# Patient Record
Sex: Male | Born: 1965 | Race: Black or African American | Hispanic: No | Marital: Married | Smoking: Never smoker
Health system: Southern US, Community
[De-identification: ages and names within clinical notes are randomized; demographics above are authoritative.]

## PROBLEM LIST (undated history)

## (undated) DIAGNOSIS — I10 Essential (primary) hypertension: Secondary | ICD-10-CM

## (undated) DIAGNOSIS — M1990 Unspecified osteoarthritis, unspecified site: Secondary | ICD-10-CM

## (undated) DIAGNOSIS — E669 Obesity, unspecified: Secondary | ICD-10-CM

## (undated) HISTORY — PX: LEG SURGERY: SHX1003

## (undated) HISTORY — PX: GASTROPLASTY DUODENAL SWITCH: SHX1699

## (undated) HISTORY — PX: FOOT SURGERY: SHX648

---

## 2002-03-06 ENCOUNTER — Emergency Department (HOSPITAL_COMMUNITY): Admission: EM | Admit: 2002-03-06 | Discharge: 2002-03-06 | Payer: Self-pay

## 2002-03-13 ENCOUNTER — Encounter: Payer: Self-pay | Admitting: Orthopedic Surgery

## 2002-03-13 ENCOUNTER — Inpatient Hospital Stay (HOSPITAL_COMMUNITY): Admission: RE | Admit: 2002-03-13 | Discharge: 2002-03-14 | Payer: Self-pay | Admitting: Orthopedic Surgery

## 2002-11-04 ENCOUNTER — Ambulatory Visit (HOSPITAL_COMMUNITY): Admission: RE | Admit: 2002-11-04 | Discharge: 2002-11-04 | Payer: Self-pay | Admitting: Cardiology

## 2003-11-16 ENCOUNTER — Ambulatory Visit (HOSPITAL_COMMUNITY): Admission: RE | Admit: 2003-11-16 | Discharge: 2003-11-16 | Payer: Self-pay | Admitting: Orthopedic Surgery

## 2004-04-16 ENCOUNTER — Emergency Department (HOSPITAL_COMMUNITY): Admission: EM | Admit: 2004-04-16 | Discharge: 2004-04-16 | Payer: Self-pay | Admitting: Emergency Medicine

## 2004-04-22 ENCOUNTER — Emergency Department (HOSPITAL_COMMUNITY): Admission: EM | Admit: 2004-04-22 | Discharge: 2004-04-22 | Payer: Self-pay | Admitting: Emergency Medicine

## 2005-03-27 ENCOUNTER — Encounter: Admission: RE | Admit: 2005-03-27 | Discharge: 2005-06-25 | Payer: Self-pay

## 2007-08-06 ENCOUNTER — Encounter (HOSPITAL_BASED_OUTPATIENT_CLINIC_OR_DEPARTMENT_OTHER): Admission: RE | Admit: 2007-08-06 | Discharge: 2007-09-09 | Payer: Self-pay | Admitting: Surgery

## 2007-09-10 ENCOUNTER — Encounter (HOSPITAL_BASED_OUTPATIENT_CLINIC_OR_DEPARTMENT_OTHER): Admission: RE | Admit: 2007-09-10 | Discharge: 2007-10-08 | Payer: Self-pay | Admitting: Surgery

## 2008-03-17 ENCOUNTER — Encounter: Admission: RE | Admit: 2008-03-17 | Discharge: 2008-03-17 | Payer: Self-pay | Admitting: Internal Medicine

## 2010-09-23 ENCOUNTER — Other Ambulatory Visit: Payer: Self-pay | Admitting: Surgery

## 2010-09-23 ENCOUNTER — Other Ambulatory Visit (HOSPITAL_COMMUNITY): Payer: Self-pay | Admitting: Surgery

## 2010-09-23 DIAGNOSIS — Z01818 Encounter for other preprocedural examination: Secondary | ICD-10-CM

## 2010-09-23 DIAGNOSIS — E669 Obesity, unspecified: Secondary | ICD-10-CM

## 2010-09-26 ENCOUNTER — Encounter: Payer: Self-pay | Admitting: Internal Medicine

## 2010-11-01 ENCOUNTER — Encounter: Admit: 2010-11-01 | Payer: Self-pay | Admitting: Surgery

## 2010-11-01 ENCOUNTER — Encounter (HOSPITAL_COMMUNITY): Payer: Self-pay

## 2010-11-01 ENCOUNTER — Ambulatory Visit: Payer: Self-pay | Admitting: *Deleted

## 2010-11-07 ENCOUNTER — Other Ambulatory Visit (HOSPITAL_COMMUNITY): Payer: Self-pay

## 2010-11-07 ENCOUNTER — Inpatient Hospital Stay (HOSPITAL_COMMUNITY): Admission: RE | Admit: 2010-11-07 | Payer: Self-pay | Source: Ambulatory Visit

## 2010-11-18 ENCOUNTER — Other Ambulatory Visit: Payer: Self-pay | Admitting: Surgery

## 2010-11-18 ENCOUNTER — Ambulatory Visit (HOSPITAL_COMMUNITY)
Admission: RE | Admit: 2010-11-18 | Discharge: 2010-11-18 | Disposition: A | Discharge status: Home / Self Care | Payer: BC Managed Care – PPO | Source: Ambulatory Visit | Attending: Surgery | Admitting: Surgery

## 2010-11-18 ENCOUNTER — Other Ambulatory Visit (HOSPITAL_COMMUNITY): Payer: Self-pay

## 2010-11-18 ENCOUNTER — Other Ambulatory Visit (HOSPITAL_COMMUNITY): Payer: Self-pay | Admitting: Surgery

## 2010-11-18 DIAGNOSIS — I498 Other specified cardiac arrhythmias: Secondary | ICD-10-CM | POA: Insufficient documentation

## 2010-11-18 DIAGNOSIS — E669 Obesity, unspecified: Secondary | ICD-10-CM

## 2010-11-18 DIAGNOSIS — Z01810 Encounter for preprocedural cardiovascular examination: Secondary | ICD-10-CM | POA: Insufficient documentation

## 2010-11-18 DIAGNOSIS — R112 Nausea with vomiting, unspecified: Secondary | ICD-10-CM | POA: Insufficient documentation

## 2010-11-18 DIAGNOSIS — Z01818 Encounter for other preprocedural examination: Secondary | ICD-10-CM | POA: Insufficient documentation

## 2010-11-18 DIAGNOSIS — R109 Unspecified abdominal pain: Secondary | ICD-10-CM | POA: Insufficient documentation

## 2011-01-17 NOTE — Assessment & Plan Note (Signed)
[facility]   NAME:  Todd [name], Todd [name] NO.:  [id number]   MEDICAL RECORD NO.:  [id number]      DATE OF BIRTH:  06/01/1966   PHYSICIAN:  Maxwell Caul, M.D.      VISIT DATE:                                   OFFICE VISIT   HISTORY:  Todd [name] returns today in followup from a traumatic wound to  his right lower extremity in the setting of very significant venostasis.  He has been treated with antibiotics and Aquacel AG as well as a Profore  wrap.  He has been tolerating this well with no excessive pain, drainage  or malodor.  He returns today in followup.   PHYSICAL EXAMINATION:  VITAL SIGNS:  Temperature is 98, pulse 78,  respirations 18, blood pressure 123/58.  EXTREMITIES:  The original  traumatic wound now measures 4 x 1.5 x 0.4.  There has been a  significant improvement in the length.  We have had probably close to  1/2 inch of healing from the inferior aspect of this wound.  The base of  it appears to be stable.  There is no evidence of surrounding infection.  However, we do not have good very good edema control here.  He also has  a purely stasis ulceration/trauma from his wrap that he had last time  just superior to the original wound.  This looks unchanged.  It has a  clean base, but no healing is evidenced.   IMPRESSION:  Traumatic wound in the setting of venostasis.  We have had  improvement in the original wound dimensions.  I have continued the  Aquacel AG, ABD pad and a Profore wrap.  I have discussed with the  nurses better edema control here with a Profore.  I have discussed all  of this in detail with the patient.  Right now, I think things are  improving.  Therefore, we should continue with the current conservative  care.  It was tempting given the chronicity of the wound to move with  advanced treatment options, however, I think simple measures for now  look as though we are having some positive effects.     ______________________________  Maxwell Caul, M.D.     MGR/MEDQ  D:  08/23/2007  T:  08/24/2007  Job:  [id number]

## 2011-01-17 NOTE — Assessment & Plan Note (Signed)
[facility]   NAME:  Todd [name], GRANADE               ACCOUNT NO.:  [id number]   MEDICAL RECORD NO.:  [id number]      DATE OF BIRTH:  06-29-1966   PHYSICIAN:  Theresia Majors. Tanda Rockers, M.D. VISIT DATE:  09/19/2007                                   OFFICE VISIT   SUBJECTIVE:  Mr. Todd [name] is a 45 year old man who we have followed for  stasis ulcer involving the right lateral lower extremity.  He returns  for follow-up.  There has been no excessive drainage, malodor, pain or  fever.   OBJECTIVE:  Blood pressure is 146/81, respirations 18, pulse rate 85,  temperature is 97.7.  Inspection of the right lateral leg shows that the  wound is completely resolved.  There is 100% re-epithelialization with  no drainage.  The pedal pulse is readily palpable.  There remains  chronic changes of stasis and evidence of his previous fracture of his  distal tibia.   ASSESSMENT:  Resolved stasis ulcer.   PLAN:  We have recommended that the patient procure bilateral 30-40 mm  compression hose and to begin wearing them immediately.  We have also  suggested that he continue to pursue a comprehensive weight-loss program  to reduce the intensity of his stasis changes and to prevent recurrent  ulcerations.  We have emphasized that the mainstay of his treatment will  be external compression and elevation of his legs as much as possible in  conjunction with a comprehensive weight-loss program.  We have given the  patient an opportunity to ask questions.  He seems to understand and  indicates that he will be compliant.  The patient is discharged.      Harold A. Tanda Rockers, M.D.  Electronically Signed     HAN/MEDQ  D:  09/19/2007  T:  09/19/2007  Job:  [id number]   cc:   Deirdre Peer. Polite, M.D.

## 2011-01-17 NOTE — Assessment & Plan Note (Signed)
[facility]   NAME:  Todd [name], Todd [name]               ACCOUNT NO.:  [id number]   MEDICAL RECORD NO.:  [id number]      DATE OF BIRTH:  07-Nov-1965   PHYSICIAN:  Theresia Majors. Tanda Rockers, M.D.      VISIT DATE:                                   OFFICE VISIT   SUBJECTIVE:  Todd [name] is a 45 year old man with a stasis ulcer  involving his lateral right lower extremity. In the interim he has worn  a Profore wrap. There has been moderate drainage and malodor. There has  been no fever. He continues to be ambulatory.   OBJECTIVE:  Blood pressure is 145/87, respirations 20, pulse rate is 77,  temperature is 97.9. Inspection of the right lateral leg shows a  moderate amount of necrosis in the depth of the wound with moderate  malodor. An EMLA block, an excisional debridement was performed with a  curette with the removal of nonviable tissue, subcutaneous tissue, scar  and reactive granulation. Thereafter the wound was irrigated and an Una  wrap was reapplied. The pedal pulse remains palpable. There is  associated 1-2+ edema.   ASSESSMENT:  Clinically improved stasis ulcer adequately debrided.   PLAN:  We will resume compression wrap therapy and re-evaluate him in  one week.      Harold A. Tanda Rockers, M.D.  Electronically Signed     HAN/MEDQ  D:  09/02/2007  T:  09/02/2007  Job:  [id number]

## 2011-01-17 NOTE — Assessment & Plan Note (Signed)
[facility]   NAME:  Todd [name]               ACCOUNT NO.:  [id number]   MEDICAL RECORD NO.:  [id number]      DATE OF BIRTH:  [birth year]   PHYSICIAN:  Theresia Majors. Tanda Rockers, M.D. VISIT DATE:  09/12/2007                                   OFFICE VISIT   SUBJECTIVE:  Todd [name] is a 45 year old man who are we are following  for stasis ulceration involving the lateral right lower extremity.  In  the interim, we have treated him with an Radio broadcast assistant.  In the interim,  there has been decreased drainage.  No malodor.  No pain or fever.  He  continues to be ambulatory.   OBJECTIVE:  VITAL SIGNS:  Blood pressure is 160/86, respirations 18,  pulse rate 80, temperature is 98.1.  RIGHT EXTREMITY:  Inspection of the right lateral leg shows that there  is decrease in volume in area of the wound.  There is healthy-appearing  granulation.  There is scant drainage.  No evidence of infection.  The  pedal pulse is 3+.   ASSESSMENT:  Clinical improvement of stasis ulcer.   PLAN:  We will reapply an Unna boot and re-evaluate the patient in 1  week.      Harold A. Tanda Rockers, M.D.  Electronically Signed     HAN/MEDQ  D:  09/12/2007  T:  09/12/2007  Job:  [id number]

## 2011-01-17 NOTE — Consult Note (Signed)
NAME:  Todd [name], Todd [name]               ACCOUNT NO.:  [id number]   MEDICAL RECORD NO.:  [id number]          PATIENT TYPE:  REC   LOCATION:  FOOT                         FACILITY:  [facility]   PHYSICIAN:  Jonelle Sports. Sevier, M.D. DATE OF BIRTH:  Nov 12, 1965   DATE OF CONSULTATION:  08/07/2007  DATE OF DISCHARGE:                                 CONSULTATION   HISTORY:  This 45 year old black male is seen at the courtesy of Dr.  Nehemiah Settle for assistance with management of a chronic wound of the right  lower extremity.   The patient is morbidly obese and has had an open wound on his right  lower extremity now for at least 4 years.   He began with a fracture to that distal lower extremity in 2003 at which  point he apparently fractured both bones and was treated with  application of plates and screws to include screw into the ankle itself  which he says subsequently has broken.  He had his surgical wound closed  originally with sutures, and then this was changed Steri-Strips with  improvement, but apparently these stuck to the protected boot he was  wearing, and when he took it off, he ripped the Steri-Strips from the  wound, and the wound dehisced.  He was told it could not be secondarily  closed, and efforts were made to close it using various treatment  methods to include initially wound VAC which was unsuccessful  because  of secondary infection. Then he apparently had two different skin grafts  in 2003, 2004, and maybe early 2005, neither which worked  satisfactorily.  Apparently since that time he has had very little  supervised treatment and has tended primarily to keep the wound cleaned  out on his own with hydrogen peroxide and clean dressings.  Fortunately,  he has avoided any major complications of which he is aware are which  are otherwise apparent.   He is referred today for our consultation and advice regarding this  extremely chronic wound.   PAST MEDICAL HISTORY:  The patient's  surgeries are only those things as  mentioned above in addition to which he has had left knee arthroscopy.  He has had no medical hospitalizations.   ALLERGIES:  He has no known medicinal allergies.   RECENT MEDICATIONS:  Have included Detrol 4 mg daily and Lipitor 40 mg  daily, but apparently he is not taking either of these at the moment.   SOCIAL HISTORY:  The patient is not employed.  He apparently does not  smoke and does not use alcoholic beverages.   FAMILY HISTORY:  Is notable for hypertension and type 2 diabetes with it  being notable that the patient himself has never tested positive for  diabetes.   REVIEW OF SYSTEMS:  The patient is morbidly obese and apparently has  entertained some consideration for gastric bypass surgery, but it is  unlikely this will be undertaken until we get his open wound closed.  He  has also had hypercholesterolemia, as previously mentioned, and has been  treated but is not currently on that treatment, this by  his own  choosing.  He had recent hemoglobin A1c of 5.6%, this in May 2008 at  which time a random nonfasting blood sugar was 97.   Aside these things, he has had trouble with his bladder probably related  to his morbid obesity.  He has been given the Detrol but has found it  easier just to void frequently and to avoid use the medication.   PHYSICAL EXAMINATION:  GENERAL:  Shows a morbidly obese young black male  in no immediate distress.  He is alert and cooperative and seems  appreciative of evaluation here.  VITAL SIGNS:  Blood pressure is 147/88, pulse 80, respirations 18,  temperature 98.2.  GENERAL:  Examination reveals nothing of note other than his morbid  obesity.  EXTREMITIES:  The lower extremity examination shows that all pulses are  intact.  He does have considerable edema bilaterally which is doubtless  primarily lymphedema and to some extent venous stasis related to his  degree of obesity.  There are no ropey veins  visible or palpable.   On the right lateral lower extremity overlying the distal aspect of the  tibia and paralleling that bone is a clean wound measuring 5.0 x 1.4 x  0.3 to 0.4 cm.  It appears well sealed and well granulated, and there is  no sinus tract and no undermining that can be appreciated on careful  exploration.  The wound margins are not thickened.   IMPRESSION:  Chronic ulceration, right lower extremity, which began as a  surgical wound with dehiscence and persists perhaps primarily due to  increased venous and lymphatic pressure secondary to his morbid obesity.   DISPOSITION:  1. Although the wound appears clean at this point, because it is so      old and because we may encounter problems as we go along, we have      cultured it to get an idea what the bacterial content and nature      might be.  2. The wound requires no debridement.  3. It is treated with an application of Aquacel AG, covered with an      absorptive pad, and that extremity is placed in a Profore wrap.   The patient will be seen again in 1 week but is instructed that, should  the wrap loosen and began to slip down, he should call in, and we will  change it immediately.   It is my impression that this wound can be expected to improve  significantly and hopefully to heal if we can control the degree of  edema and venous and lymphatic pressure in that extremity.  At some  point, he may well be a candidate for a leg pump.  It is also possible  that, as we go along, there might be a time that he would benefit from  the use of Apligraf, but that judgment is withheld for the present.   Again, followup visit here will be in 1 week.           ______________________________  Jonelle Sports. Cheryll Cockayne, M.D.     RES/MEDQ  D:  08/07/2007  T:  08/07/2007  Job:  [id number]   cc:   Deirdre Peer. Polite, M.D.

## 2011-01-17 NOTE — Assessment & Plan Note (Signed)
[facility]   NAME:  Todd [name], [name]               ACCOUNT NO.:  [id number]   MEDICAL RECORD NO.:  [id number]      DATE OF BIRTH:  March 23, 1966   PHYSICIAN:  Maxwell Caul, M.D. VISIT DATE:  08/16/2007                                   OFFICE VISIT   PURPOSE TODAY'S VISIT:  Followup of what was originally a traumatic  wound in his right lower extremity complicated by venous stasis and  lymphedema.   Mr. Pallas was seen here initially on December 3 by Dr. Cheryll Cockayne.  He was  treated with Aquacel AG and a Profore wrap.  He did have problems with  the original wrap returned and had this replaced.  However, recently has  had no pain, no fever and he has tolerated the wrap well.   WOUND EXAM:  His temperature is 97, pulse 86, respirations 16, blood  pressure 155/84.  He now has a second open area that looks mostly like a venous stasis  ulcer just above the original wound.  This is superficial and does not  appear to be infected.  There is a still fair amount of edema around  both of these wounds which is mostly lymphedema.  It is nonpitting there  is no evidence of cellulitis here although his original wound did  culture a methicillin-sensitive staph aureus and he is on Keflex 500  t.i.d.   IMPRESSION:  Traumatic wound in the setting of venous stasis.  This is a  chronic wound.  I did probe this.  There are no sinus tracts.  It  cleaned out very easily to a healthy looking base.  I think we should  continue the current plan as outlined.  I have continued the Aquacel Ag  with an ABD pad and a Profore wrap.  He will continue on the Keflex.  I  think the superficial injury above the original wound is probably due to  trauma of the original wrap and I think this will hopefully not cause  any other problems.  I agree that, given the chronicity of the wound, it  is tempting to think of moving right to advanced treatment options.  However, it does not look as though he is  ever had adequate edema  control and for now I think that should be the paramount focus as well  as treating the staph aureus infection/colonization.           ______________________________  Maxwell Caul, M.D.     MGR/MEDQ  D:  08/16/2007  T:  08/16/2007  Job:  [id number]

## 2011-01-20 NOTE — Op Note (Signed)
[place]  Patient:    Todd [name], Todd [name] Visit Number: [id number] MRN: [id number]          Service Type: SUR Location: [id number] Attending Physician:  Verlee Rossetti. Dictated by:   Almedia Balls Ranell Patrick, M.D. Proc. Date: 03/13/02 Admit Date:  03/13/2002 Discharge Date: 03/14/2002                             Operative Report  PREOPERATIVE DIAGNOSIS:  Right Weber C (PER4) ankle fracture.  POSTOPERATIVE DIAGNOSIS:  Right Weber C (PER4) ankle fracture.  OPERATION PERFORMED:  Open reduction internal fixation of right ankle fracture with Syndesmosis screws.  SURGEON:  Almedia Balls. Ranell Patrick, M.D.  ASSISTANT:  Clarene Reamer, PA-C  ANESTHESIA:  General.  ESTIMATED BLOOD LOSS:  Minimal.  FLUID REPLACEMENT:  800 cc crystalloid.  INSTRUMENT COUNT:  Correct.  COMPLICATIONS:  None.  INDICATIONS FOR PROCEDURE:  The patient is a 46 year old male who was injured on the job, sustaining a twisting injury to his right ankle.  The patient complained of knee deformity and presented to [employer] [facility] [facility] where x-rays demonstrated ankle fracture dislocation. The patient was reduced by the emergency department and presented to orthopedics for follow-up where he was noted to have an unstable ankle fracture pattern.  This is a pronation external rotation injury with a wide syndesmosis and disruption of the medial deltoid ligament.  There was a high fibular fracture as well.  After discussion with the patient, options for treatment including conservative management with cast treatment versus operative fixation, operative fixation was recommended due to the extremely unstable nature of this fracture pattern and the possibility for long term arthritis with instability in the ankle.  Patient agreed.  Informed consent was obtained and was on the chart.  DESCRIPTION OF PROCEDURE:  After an adequate level of anesthesia was  achieved, 1 gm of Ancef was given preoperatively.  The patient was positioned supine on the operating table.  A bump was placed on the right hip.  A right calf tourniquet was placed on this patient, who weighs 390 pounds.  The right leg was then prepped and draped in its entirety in the usual sterile fashion.  A longitudinal skin incision was created laterally directly overlying the fractured fibula.  This was done after exsanguination of the limb and elevation of the tourniquet to 300 mHg.  Dissection was carried sharply down to the fracture site which was identified and dissected subperiosteally.  Soft tissue was removed from the fracture site.  The fracture was reduced anatomically and a 3.5 DC plate was applied to the lateral fibula.  Excellent purchase was achieved with cortical screws proximally and distally and good alignment of the fracture in AP and lateral planes was identified.  The patients ankle mortise was reduced.  However, there was noted to be a widened syndesmosis.  At this point a clamp was used to clamp the medial and lateral malleoli together effectively reducing the mortise and then two 4.5 mm screws were placed across the syndesmosis.  These screws each had four cortices. Again, AP and lateral C-arm views were used to demonstrate appropriate screw placement and reduction of mortise.  After this was completed, the wound was irrigated and then sutured using 0 Vicryl, 2-0 Vicryl and then staples.  A sterile dressing followed by a short leg splint was performed.  The tourniquet was deflated at under an hour.  The patient tolerated the procedure well and was taken to PACU in stable condition. Dictated by:   Almedia Balls Ranell Patrick, M.D. Attending Physician:  Malon Kindle R. DD:  03/14/02 TD:  03/18/02 Job: [id number]

## 2011-02-22 ENCOUNTER — Encounter: Payer: BC Managed Care – PPO | Attending: Surgery | Admitting: *Deleted

## 2011-02-22 ENCOUNTER — Other Ambulatory Visit (INDEPENDENT_AMBULATORY_CARE_PROVIDER_SITE_OTHER): Payer: Self-pay | Admitting: Surgery

## 2011-02-22 DIAGNOSIS — Z713 Dietary counseling and surveillance: Secondary | ICD-10-CM | POA: Insufficient documentation

## 2011-02-22 DIAGNOSIS — Z01818 Encounter for other preprocedural examination: Secondary | ICD-10-CM | POA: Insufficient documentation

## 2011-02-22 LAB — CBC WITH DIFFERENTIAL/PLATELET
Basophils Absolute: 0 10*3/uL (ref 0.0–0.1)
Basophils Relative: 0 % (ref 0–1)
Eosinophils Relative: 2 % (ref 0–5)
Lymphocytes Relative: 30 % (ref 12–46)
Lymphs Abs: 2 10*3/uL (ref 0.7–4.0)
Monocytes Absolute: 0.6 10*3/uL (ref 0.1–1.0)
Monocytes Relative: 9 % (ref 3–12)
Neutro Abs: 4 10*3/uL (ref 1.7–7.7)
Platelets: 147 10*3/uL — ABNORMAL LOW (ref 150–400)
RBC: 4.89 MIL/uL (ref 4.22–5.81)
WBC: 6.7 10*3/uL (ref 4.0–10.5)

## 2011-02-22 LAB — COMPREHENSIVE METABOLIC PANEL
ALT: 20 U/L (ref 0–53)
Calcium: 9.2 mg/dL (ref 8.4–10.5)
Chloride: 102 mEq/L (ref 96–112)
Potassium: 4.3 mEq/L (ref 3.5–5.3)
Sodium: 137 mEq/L (ref 135–145)
Total Bilirubin: 1 mg/dL (ref 0.3–1.2)

## 2011-02-22 LAB — T4, FREE: Free T4: 0.96 ng/dL (ref 0.80–1.80)

## 2011-02-22 LAB — T4: T4, Total: 6.7 ug/dL (ref 5.0–12.5)

## 2011-04-28 ENCOUNTER — Ambulatory Visit (HOSPITAL_COMMUNITY)
Admission: RE | Admit: 2011-04-28 | Discharge: 2011-04-28 | Disposition: A | Discharge status: Home / Self Care | Payer: BC Managed Care – PPO | Source: Ambulatory Visit | Attending: Surgery | Admitting: Surgery

## 2011-04-28 DIAGNOSIS — Z01818 Encounter for other preprocedural examination: Secondary | ICD-10-CM | POA: Insufficient documentation

## 2011-04-28 DIAGNOSIS — E669 Obesity, unspecified: Secondary | ICD-10-CM | POA: Insufficient documentation

## 2011-07-07 ENCOUNTER — Ambulatory Visit (INDEPENDENT_AMBULATORY_CARE_PROVIDER_SITE_OTHER): Payer: BC Managed Care – PPO | Admitting: Surgery

## 2011-08-04 ENCOUNTER — Inpatient Hospital Stay (HOSPITAL_COMMUNITY): Admission: RE | Admit: 2011-08-04 | Payer: BC Managed Care – PPO | Source: Ambulatory Visit | Admitting: Surgery

## 2011-08-18 ENCOUNTER — Ambulatory Visit (INDEPENDENT_AMBULATORY_CARE_PROVIDER_SITE_OTHER): Payer: BC Managed Care – PPO | Admitting: Surgery

## 2013-04-09 ENCOUNTER — Emergency Department (HOSPITAL_COMMUNITY)
Admission: EM | Admit: 2013-04-09 | Discharge: 2013-04-09 | Disposition: A | Discharge status: Home / Self Care | Payer: BC Managed Care – PPO | Attending: Emergency Medicine | Admitting: Emergency Medicine

## 2013-04-09 ENCOUNTER — Encounter (HOSPITAL_COMMUNITY): Payer: Self-pay | Admitting: Emergency Medicine

## 2013-04-09 DIAGNOSIS — T368X5A Adverse effect of other systemic antibiotics, initial encounter: Secondary | ICD-10-CM | POA: Insufficient documentation

## 2013-04-09 DIAGNOSIS — R21 Rash and other nonspecific skin eruption: Secondary | ICD-10-CM | POA: Insufficient documentation

## 2013-04-09 DIAGNOSIS — T368X1A Poisoning by other systemic antibiotics, accidental (unintentional), initial encounter: Secondary | ICD-10-CM | POA: Insufficient documentation

## 2013-04-09 DIAGNOSIS — I10 Essential (primary) hypertension: Secondary | ICD-10-CM | POA: Insufficient documentation

## 2013-04-09 DIAGNOSIS — T7840XA Allergy, unspecified, initial encounter: Secondary | ICD-10-CM

## 2013-04-09 DIAGNOSIS — E669 Obesity, unspecified: Secondary | ICD-10-CM | POA: Insufficient documentation

## 2013-04-09 HISTORY — DX: Obesity, unspecified: E66.9

## 2013-04-09 HISTORY — DX: Essential (primary) hypertension: I10

## 2013-04-09 MED ORDER — FAMOTIDINE 20 MG PO TABS: 40.0000 mg | ORAL_TABLET | Freq: Two times a day (BID) | ORAL | Status: DC

## 2013-04-09 MED ORDER — PREDNISONE 10 MG PO TABS: 20.0000 mg | ORAL_TABLET | Freq: Every day | ORAL | Status: DC

## 2013-04-09 MED ORDER — PREDNISONE 20 MG PO TABS
60.0000 mg | ORAL_TABLET | Freq: Once | ORAL | Status: AC
  Administered 2013-04-09: 60 mg via ORAL
  Filled 2013-04-09: qty 3

## 2013-04-09 MED ORDER — DIPHENHYDRAMINE HCL 25 MG PO TABS: 50.0000 mg | ORAL_TABLET | Freq: Four times a day (QID) | ORAL | Status: DC

## 2013-04-09 MED ORDER — FAMOTIDINE 20 MG PO TABS
40.0000 mg | ORAL_TABLET | Freq: Once | ORAL | Status: AC
  Administered 2013-04-09: 40 mg via ORAL
  Filled 2013-04-09: qty 2

## 2013-04-09 MED ORDER — DIPHENHYDRAMINE HCL 25 MG PO CAPS
50.0000 mg | ORAL_CAPSULE | Freq: Once | ORAL | Status: AC
  Administered 2013-04-09: 50 mg via ORAL
  Filled 2013-04-09: qty 2

## 2013-04-09 NOTE — ED Notes (Signed)
PT. REPORTS GENERALIZED ITCHING ONSET TODAY , PT. STATED THIS IS  HIS 2ND DAY TAKING AMOXICILLIN FOR TOOTH INFECTION , AIRWAY INTACT / RESPIRATIONS UNLABORED .

## 2013-04-09 NOTE — ED Provider Notes (Signed)
History  This chart was scribed for non-physician practitioner, Todd Helper PA-C, working with Juliet Rude. Rubin Payor, MD by Ardeen Jourdain, ED Scribe. This patient was seen in room TR04C/TR04C and the patient's care was started at 2201.  CSN: [id number]     Arrival date & time 04/09/13  2102  First MD Initiated Contact with Patient 04/09/13 2201     Chief Complaint  Patient presents with  . Allergic Reaction    The history is provided by the patient. No language interpreter was used.    HPI Comments: MARTELL MCFADYEN is a 47 y.o. male who presents to the Emergency Department complaining of a gradual onset, gradually worsening, constant allergic reaction to amoxicillin. Pt is c/o generalized itching and rash. He reports taking the antibiotic for a tooth infection. He reports starting the antibiotic yesterday. Pt denies ant trouble breathing, trouble swallowing, nausea, emesis or fever as associated symptoms. He denies taking any medications or attempting to treat the rash.    Past Medical History  Diagnosis Date  . Obesity   . Hypertension    Past Surgical History  Procedure Laterality Date  . Leg surgery    . Foot surgery     No family history on file. History  Substance Use Topics  . Smoking status: Never Smoker   . Smokeless tobacco: Not on file  . Alcohol Use: Yes    Review of Systems  Skin: Positive for rash.       Generalized itching   All other systems reviewed and are negative.    Allergies  Amoxil and Tylox  Home Medications   Current Outpatient Rx  Name  Route  Sig  Dispense  Refill  . hydrocortisone cream 1 %   Topical   Apply 1 application topically 2 (two) times daily as needed (for itching).          Triage Vitals: BP 172/94  Pulse 107  Temp(Src) 98.2 F (36.8 C) (Oral)  Resp 20  SpO2 95%  Physical Exam  Nursing note and vitals reviewed. Constitutional: He is oriented to person, place, and time. He appears well-developed and well-nourished.  No distress.  HENT:  Head: Normocephalic and atraumatic.  Mouth/Throat: No posterior oropharyngeal edema or posterior oropharyngeal erythema.  Eyes: EOM are normal. Pupils are equal, round, and reactive to light.  Neck: Normal range of motion. Neck supple. No tracheal deviation present.  Cardiovascular: Normal rate, regular rhythm and normal heart sounds.  Exam reveals no gallop and no friction rub.   No murmur heard. Pulmonary/Chest: Effort normal and breath sounds normal. No respiratory distress. He has no wheezes. He has no rales. He exhibits no tenderness.  Abdominal: Soft. He exhibits no distension.  Musculoskeletal: Normal range of motion. He exhibits no edema.  Neurological: He is alert and oriented to person, place, and time.  Skin: Skin is warm and dry. Rash noted.  Hives throughout the body involving the trunk, back and bilateral forearms  Psychiatric: He has a normal mood and affect. His behavior is normal.    ED Course   Procedures (including critical care time)  DIAGNOSTIC STUDIES: Oxygen Saturation is 95% on room air, adequate by my interpretation.    COORDINATION OF CARE:  10:30 PM-Discussed treatment plan which includes benadryl and instructions for home care with pt at bedside and pt agreed to plan.   11:05 PM No airway compromise. sxs not progressive.  Stable for discharge.  Return precaution discussed.  Pt also made aware that BP is  high and will need to have it recheck by PCP.   Labs Reviewed - No data to display No results found. 1. Allergic reaction to drug     MDM  BP 172/94  Pulse 107  Temp(Src) 98.2 F (36.8 C) (Oral)  Resp 20  SpO2 95%   I personally performed the services described in this documentation, which was scribed in my presence. The recorded information has been reviewed and is accurate.     Todd Helper, PA-C 04/09/13 2306

## 2013-04-10 NOTE — ED Provider Notes (Signed)
Medical screening examination/treatment/procedure(s) were performed by non-physician practitioner and as supervising physician I was immediately available for consultation/collaboration.  Contrina Orona R. Tryce Surratt, MD 04/10/13 2336 

## 2015-12-31 DIAGNOSIS — E291 Testicular hypofunction: Secondary | ICD-10-CM | POA: Diagnosis not present

## 2016-01-06 DIAGNOSIS — G4730 Sleep apnea, unspecified: Secondary | ICD-10-CM | POA: Diagnosis not present

## 2016-01-06 DIAGNOSIS — E291 Testicular hypofunction: Secondary | ICD-10-CM | POA: Diagnosis not present

## 2016-01-06 DIAGNOSIS — I10 Essential (primary) hypertension: Secondary | ICD-10-CM | POA: Diagnosis not present

## 2016-02-02 DIAGNOSIS — E291 Testicular hypofunction: Secondary | ICD-10-CM | POA: Diagnosis not present

## 2016-03-02 DIAGNOSIS — E291 Testicular hypofunction: Secondary | ICD-10-CM | POA: Diagnosis not present

## 2016-04-03 ENCOUNTER — Encounter (HOSPITAL_COMMUNITY): Payer: Self-pay | Admitting: Emergency Medicine

## 2016-04-03 ENCOUNTER — Emergency Department (HOSPITAL_COMMUNITY)
Admission: EM | Admit: 2016-04-03 | Discharge: 2016-04-03 | Disposition: A | Discharge status: Home / Self Care | Payer: BLUE CROSS/BLUE SHIELD | Attending: Emergency Medicine | Admitting: Emergency Medicine

## 2016-04-03 DIAGNOSIS — M545 Low back pain: Secondary | ICD-10-CM | POA: Diagnosis not present

## 2016-04-03 DIAGNOSIS — Z79899 Other long term (current) drug therapy: Secondary | ICD-10-CM | POA: Diagnosis not present

## 2016-04-03 DIAGNOSIS — E291 Testicular hypofunction: Secondary | ICD-10-CM | POA: Diagnosis not present

## 2016-04-03 DIAGNOSIS — M5442 Lumbago with sciatica, left side: Secondary | ICD-10-CM | POA: Diagnosis not present

## 2016-04-03 DIAGNOSIS — Z6841 Body Mass Index (BMI) 40.0 and over, adult: Secondary | ICD-10-CM | POA: Insufficient documentation

## 2016-04-03 DIAGNOSIS — M62830 Muscle spasm of back: Secondary | ICD-10-CM | POA: Diagnosis not present

## 2016-04-03 DIAGNOSIS — I10 Essential (primary) hypertension: Secondary | ICD-10-CM | POA: Diagnosis not present

## 2016-04-03 DIAGNOSIS — M5416 Radiculopathy, lumbar region: Secondary | ICD-10-CM | POA: Diagnosis not present

## 2016-04-03 DIAGNOSIS — G5702 Lesion of sciatic nerve, left lower limb: Secondary | ICD-10-CM | POA: Diagnosis not present

## 2016-04-03 MED ORDER — HYDROCODONE-ACETAMINOPHEN 5-325 MG PO TABS
1.0000 | ORAL_TABLET | Freq: Once | ORAL | Status: AC
  Administered 2016-04-03: 1 via ORAL
  Filled 2016-04-03: qty 1

## 2016-04-03 MED ORDER — IBUPROFEN 800 MG PO TABS
800.0000 mg | ORAL_TABLET | Freq: Once | ORAL | Status: AC
  Administered 2016-04-03: 800 mg via ORAL
  Filled 2016-04-03: qty 1

## 2016-04-03 MED ORDER — HYDROCODONE-ACETAMINOPHEN 5-325 MG PO TABS: 1.0000 | ORAL_TABLET | ORAL | 0 refills | Status: DC | PRN

## 2016-04-03 MED ORDER — NAPROXEN 500 MG PO TABS: 500.0000 mg | ORAL_TABLET | Freq: Two times a day (BID) | ORAL | 0 refills | Status: DC

## 2016-04-03 MED ORDER — ORPHENADRINE CITRATE ER 100 MG PO TB12: 100.0000 mg | ORAL_TABLET | Freq: Two times a day (BID) | ORAL | 0 refills | Status: DC

## 2016-04-03 MED ORDER — CYCLOBENZAPRINE HCL 10 MG PO TABS
10.0000 mg | ORAL_TABLET | Freq: Once | ORAL | Status: AC
  Administered 2016-04-03: 10 mg via ORAL
  Filled 2016-04-03: qty 1

## 2016-04-03 NOTE — ED Provider Notes (Signed)
[facility] DEPT Provider Note   CSN: [id number] Arrival date & time: 04/03/16  0001  By signing my name below, I, Irene Pap, attest that this documentation has been prepared under the direction and in the presence of Delora Fuel, MD. Electronically Signed: Irene Pap, ED Scribe. 04/03/16. 3:36 AM.  First Provider Contact:  First MD Initiated Contact with Patient 04/03/16 [phone]     History   Chief Complaint Chief Complaint  Patient presents with  . Back Pain   The history is provided by the patient. No language interpreter was used.   HPI Comments: Todd [name] is a 50 y.o. male with a hx of HTN who presents to the Emergency Department complaining of intermittent, gradually worsening, sharp, stabbing lower back pain onset 3 weeks ago. He reports that the pain recently worsened to the point where it radiates to the left thigh. He states that the pain will last about 5 minutes at a time, but will return if he moves certain ways. He rates his pain 10/0 at its worst. 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 Pt has taken Ibuprofen and used pain ointment on the area to no relief. Pt states that he has an appointment with a chiropractor tomorrow. He denies hx of back pain, IVDU, hx of cancer, recent fall or injury, numbness, weakness, bladder or bowel incontinence, hematuria, or dysuria. Pt is allergic to Tylox.   Past Medical History:  Diagnosis Date  . Hypertension   . Obesity     There are no active problems to display for this patient.   Past Surgical History:  Procedure Laterality Date  . FOOT SURGERY    . LEG SURGERY       Home Medications    Prior to Admission medications   Medication Sig Start Date End Date Taking? Authorizing Provider  diphenhydrAMINE (BENADRYL) 25 MG tablet Take 2 tablets (50 mg total) by mouth every 6 (six) hours. 04/09/13   Domenic Moras, PA-C  famotidine (PEPCID) 20 MG tablet Take 2 tablets (40 mg total) by mouth 2 (two)  times daily. 04/09/13   Domenic Moras, PA-C  hydrocortisone cream 1 % Apply 1 application topically 2 (two) times daily as needed (for itching).    Historical Provider, MD  predniSONE (DELTASONE) 10 MG tablet Take 2 tablets (20 mg total) by mouth daily. 04/09/13   Domenic Moras, PA-C    Family History No family history on file.  Social History Social History  Substance Use Topics  . Smoking status: Never Smoker  . Smokeless tobacco: Not on file  . Alcohol use Yes     Allergies   Amoxil [amoxicillin] and Tylox [oxycodone-acetaminophen]   Review of Systems Review of Systems  Genitourinary: Negative for dysuria and hematuria.  Musculoskeletal: Positive for back pain.  Neurological: Negative for weakness and numbness.  All other systems reviewed and are negative.    Physical Exam Updated Vital Signs BP 187/81 (BP Location: Left Arm)   Pulse 82   Temp 97.9 F (36.6 C) (Oral)   Resp 20   Ht 6' (1.829 m)   Wt (!) 480 lb (217.7 kg)   SpO2 96%   BMI 65.10 kg/m   Physical Exam  Constitutional: He is oriented to person, place, and time. He appears well-developed and well-nourished.  Morbidly obese  HENT:  Head: Normocephalic and atraumatic.  Eyes: EOM are normal. Pupils are equal, round, and reactive to light.  Neck: Normal range  of motion. Neck supple. No JVD present.  Cardiovascular: Normal rate, regular rhythm and normal heart sounds.   No murmur heard. Pulmonary/Chest: Effort normal and breath sounds normal. He has no wheezes. He has no rales. He exhibits no tenderness.  Abdominal: Soft. He exhibits no distension and no mass. There is no tenderness.  Musculoskeletal: Normal range of motion. He exhibits no edema.       Lumbar back: He exhibits tenderness and spasm.  Mildly tender left lower para-lumbar area; mild to moderate bilateral para-lumbar spasm; 2+ pretibial edema with moderate venous stasis changes  Lymphadenopathy:    He has no cervical adenopathy.  Neurological: He  is alert and oriented to person, place, and time. No cranial nerve deficit. He exhibits normal muscle tone. Coordination normal.  Positive SLR bilaterally at 30 degrees  Skin: Skin is warm and dry. No rash noted.  Psychiatric: He has a normal mood and affect. His behavior is normal. Judgment and thought content normal.  Nursing note and vitals reviewed.    ED Treatments / Results  DIAGNOSTIC STUDIES: Oxygen Saturation is 96% on RA, normal by my interpretation.    COORDINATION OF CARE: 3:30 AM-Discussed treatment plan which includes naproxen and percocet with pt at bedside and pt agreed to plan.   Procedures Procedures (including critical care time)  Medications Ordered in ED Medications  HYDROcodone-acetaminophen (NORCO/VICODIN) 5-325 MG per tablet 1 tablet (1 tablet Oral Given 04/03/16 0355)  ibuprofen (ADVIL,MOTRIN) tablet 800 mg (800 mg Oral Given 04/03/16 0356)  cyclobenzaprine (FLEXERIL) tablet 10 mg (10 mg Oral Given 04/03/16 0355)   Initial Impression / Assessment and Plan / ED Course  I have reviewed the triage vital signs and the nursing notes.  Pertinent labs & imaging results that were available during my care of the patient were reviewed by me and considered in my medical decision making (see chart for details).  Clinical Course    Low back pain which appears musculoskeletal. There is an element of sciatica present. Old records are reviewed and he has no similar past visits. Ultrasound of the abdomen in 2012 showed no evidence of abdominal aortic aneurysm. No red flags suggest neurologic injury or serious causes of back pain. He is discharged with prescriptions for naproxen, orphenadrine, and hydrocodone-acetaminophen. Of note, he is allergic to oxycodone-acetaminophen, but has taken hydrocodone-acetaminophen without developing a rash. He is referred back to his PCP to consider ranging from patient physical therapy. He is advised to lose weight. He also has an appointment  with a chiropractor later today and is advised to keep that appointment.  Final Clinical Impressions(s) / ED Diagnoses   Final diagnoses:  Left-sided low back pain with left-sided sciatica  Morbid obesity, unspecified obesity type ([place])  I personally performed the services described in this documentation, which was scribed in my presence. The recorded information has been reviewed and is accurate.    New Prescriptions New Prescriptions   HYDROCODONE-ACETAMINOPHEN (NORCO) 5-325 MG TABLET    Take 1 tablet by mouth every 4 (four) hours as needed for moderate pain.   NAPROXEN (NAPROSYN) 500 MG TABLET    Take 1 tablet (500 mg total) by mouth 2 (two) times daily.   ORPHENADRINE (NORFLEX) 100 MG TABLET    Take 1 tablet (100 mg total) by mouth 2 (two) times daily.     Delora Fuel, MD [id number]

## 2016-04-03 NOTE — ED Triage Notes (Signed)
Pt. reports low back pain for 3 weeks , denies injury or fall /ambulatory , no urinary discomfort or hematuria .

## 2016-04-04 DIAGNOSIS — M545 Low back pain: Secondary | ICD-10-CM | POA: Diagnosis not present

## 2016-04-04 DIAGNOSIS — G5702 Lesion of sciatic nerve, left lower limb: Secondary | ICD-10-CM | POA: Diagnosis not present

## 2016-04-04 DIAGNOSIS — M5416 Radiculopathy, lumbar region: Secondary | ICD-10-CM | POA: Diagnosis not present

## 2016-04-04 DIAGNOSIS — M62830 Muscle spasm of back: Secondary | ICD-10-CM | POA: Diagnosis not present

## 2016-04-07 DIAGNOSIS — M62830 Muscle spasm of back: Secondary | ICD-10-CM | POA: Diagnosis not present

## 2016-04-07 DIAGNOSIS — M545 Low back pain: Secondary | ICD-10-CM | POA: Diagnosis not present

## 2016-04-07 DIAGNOSIS — M5416 Radiculopathy, lumbar region: Secondary | ICD-10-CM | POA: Diagnosis not present

## 2016-04-07 DIAGNOSIS — G5702 Lesion of sciatic nerve, left lower limb: Secondary | ICD-10-CM | POA: Diagnosis not present

## 2016-04-12 DIAGNOSIS — G5702 Lesion of sciatic nerve, left lower limb: Secondary | ICD-10-CM | POA: Diagnosis not present

## 2016-04-12 DIAGNOSIS — M545 Low back pain: Secondary | ICD-10-CM | POA: Diagnosis not present

## 2016-04-12 DIAGNOSIS — M62830 Muscle spasm of back: Secondary | ICD-10-CM | POA: Diagnosis not present

## 2016-04-12 DIAGNOSIS — M5416 Radiculopathy, lumbar region: Secondary | ICD-10-CM | POA: Diagnosis not present

## 2016-04-17 DIAGNOSIS — G5702 Lesion of sciatic nerve, left lower limb: Secondary | ICD-10-CM | POA: Diagnosis not present

## 2016-04-17 DIAGNOSIS — M5416 Radiculopathy, lumbar region: Secondary | ICD-10-CM | POA: Diagnosis not present

## 2016-04-17 DIAGNOSIS — M62830 Muscle spasm of back: Secondary | ICD-10-CM | POA: Diagnosis not present

## 2016-04-17 DIAGNOSIS — M545 Low back pain: Secondary | ICD-10-CM | POA: Diagnosis not present

## 2016-06-02 DIAGNOSIS — E291 Testicular hypofunction: Secondary | ICD-10-CM | POA: Diagnosis not present

## 2016-06-30 DIAGNOSIS — E291 Testicular hypofunction: Secondary | ICD-10-CM | POA: Diagnosis not present

## 2016-08-02 DIAGNOSIS — E291 Testicular hypofunction: Secondary | ICD-10-CM | POA: Diagnosis not present

## 2016-08-30 DIAGNOSIS — E291 Testicular hypofunction: Secondary | ICD-10-CM | POA: Diagnosis not present

## 2016-10-02 DIAGNOSIS — E291 Testicular hypofunction: Secondary | ICD-10-CM | POA: Diagnosis not present

## 2016-11-01 DIAGNOSIS — E291 Testicular hypofunction: Secondary | ICD-10-CM | POA: Diagnosis not present

## 2016-12-06 DIAGNOSIS — E291 Testicular hypofunction: Secondary | ICD-10-CM | POA: Diagnosis not present

## 2016-12-29 DIAGNOSIS — E291 Testicular hypofunction: Secondary | ICD-10-CM | POA: Diagnosis not present

## 2017-01-31 DIAGNOSIS — E291 Testicular hypofunction: Secondary | ICD-10-CM | POA: Diagnosis not present

## 2017-02-28 DIAGNOSIS — E291 Testicular hypofunction: Secondary | ICD-10-CM | POA: Diagnosis not present

## 2017-04-06 DIAGNOSIS — I10 Essential (primary) hypertension: Secondary | ICD-10-CM | POA: Diagnosis not present

## 2017-04-06 DIAGNOSIS — M25569 Pain in unspecified knee: Secondary | ICD-10-CM | POA: Diagnosis not present

## 2017-04-06 DIAGNOSIS — E291 Testicular hypofunction: Secondary | ICD-10-CM | POA: Diagnosis not present

## 2017-04-12 DIAGNOSIS — M25561 Pain in right knee: Secondary | ICD-10-CM | POA: Diagnosis not present

## 2017-04-12 DIAGNOSIS — M25562 Pain in left knee: Secondary | ICD-10-CM | POA: Diagnosis not present

## 2017-06-14 DIAGNOSIS — Z0000 Encounter for general adult medical examination without abnormal findings: Secondary | ICD-10-CM | POA: Diagnosis not present

## 2017-06-14 DIAGNOSIS — Z125 Encounter for screening for malignant neoplasm of prostate: Secondary | ICD-10-CM | POA: Diagnosis not present

## 2017-07-09 ENCOUNTER — Other Ambulatory Visit: Payer: Self-pay | Admitting: Gastroenterology

## 2017-07-09 DIAGNOSIS — Z1211 Encounter for screening for malignant neoplasm of colon: Secondary | ICD-10-CM | POA: Diagnosis not present

## 2017-07-09 DIAGNOSIS — Z01818 Encounter for other preprocedural examination: Secondary | ICD-10-CM | POA: Diagnosis not present

## 2017-07-24 ENCOUNTER — Encounter (HOSPITAL_COMMUNITY): Payer: Self-pay | Admitting: Emergency Medicine

## 2017-07-24 ENCOUNTER — Other Ambulatory Visit: Payer: Self-pay

## 2017-08-02 ENCOUNTER — Other Ambulatory Visit: Payer: Self-pay | Admitting: Gastroenterology

## 2017-08-07 ENCOUNTER — Ambulatory Visit (HOSPITAL_COMMUNITY)
Admission: RE | Admit: 2017-08-07 | Discharge: 2017-08-07 | Disposition: A | Discharge status: Home / Self Care | Payer: BLUE CROSS/BLUE SHIELD | Source: Ambulatory Visit | Attending: Gastroenterology | Admitting: Gastroenterology

## 2017-08-07 ENCOUNTER — Ambulatory Visit (HOSPITAL_COMMUNITY): Payer: BLUE CROSS/BLUE SHIELD | Admitting: Anesthesiology

## 2017-08-07 ENCOUNTER — Other Ambulatory Visit: Payer: Self-pay

## 2017-08-07 ENCOUNTER — Encounter (HOSPITAL_COMMUNITY): Payer: Self-pay

## 2017-08-07 ENCOUNTER — Encounter (HOSPITAL_COMMUNITY)
Admission: RE | Disposition: A | Discharge status: Home / Self Care | Payer: Self-pay | Source: Ambulatory Visit | Attending: Gastroenterology

## 2017-08-07 DIAGNOSIS — D128 Benign neoplasm of rectum: Secondary | ICD-10-CM | POA: Insufficient documentation

## 2017-08-07 DIAGNOSIS — M1990 Unspecified osteoarthritis, unspecified site: Secondary | ICD-10-CM | POA: Diagnosis not present

## 2017-08-07 DIAGNOSIS — Z6841 Body Mass Index (BMI) 40.0 and over, adult: Secondary | ICD-10-CM | POA: Insufficient documentation

## 2017-08-07 DIAGNOSIS — K640 First degree hemorrhoids: Secondary | ICD-10-CM | POA: Diagnosis not present

## 2017-08-07 DIAGNOSIS — K219 Gastro-esophageal reflux disease without esophagitis: Secondary | ICD-10-CM | POA: Diagnosis not present

## 2017-08-07 DIAGNOSIS — I10 Essential (primary) hypertension: Secondary | ICD-10-CM | POA: Insufficient documentation

## 2017-08-07 DIAGNOSIS — K621 Rectal polyp: Secondary | ICD-10-CM | POA: Diagnosis not present

## 2017-08-07 DIAGNOSIS — Z1211 Encounter for screening for malignant neoplasm of colon: Secondary | ICD-10-CM | POA: Diagnosis not present

## 2017-08-07 DIAGNOSIS — K635 Polyp of colon: Secondary | ICD-10-CM | POA: Insufficient documentation

## 2017-08-07 DIAGNOSIS — D122 Benign neoplasm of ascending colon: Secondary | ICD-10-CM | POA: Diagnosis not present

## 2017-08-07 DIAGNOSIS — K5730 Diverticulosis of large intestine without perforation or abscess without bleeding: Secondary | ICD-10-CM | POA: Diagnosis not present

## 2017-08-07 HISTORY — DX: Unspecified osteoarthritis, unspecified site: M19.90

## 2017-08-07 HISTORY — PX: COLONOSCOPY WITH PROPOFOL: SHX5780

## 2017-08-07 SURGERY — COLONOSCOPY WITH PROPOFOL
Anesthesia: Monitor Anesthesia Care

## 2017-08-07 MED ORDER — PROPOFOL 10 MG/ML IV BOLUS
INTRAVENOUS | Status: DC | PRN
  Administered 2017-08-07: 50 mg via INTRAVENOUS
  Administered 2017-08-07: 60 mg via INTRAVENOUS

## 2017-08-07 MED ORDER — LIDOCAINE 2% (20 MG/ML) 5 ML SYRINGE
INTRAMUSCULAR | Status: DC | PRN
  Administered 2017-08-07: 100 mg via INTRAVENOUS

## 2017-08-07 MED ORDER — LACTATED RINGERS IV SOLN
INTRAVENOUS | Status: DC
  Administered 2017-08-07 (×2): via INTRAVENOUS

## 2017-08-07 MED ORDER — PROPOFOL 10 MG/ML IV BOLUS
INTRAVENOUS | Status: AC
  Filled 2017-08-07: qty 60

## 2017-08-07 MED ORDER — PROPOFOL 10 MG/ML IV BOLUS
INTRAVENOUS | Status: AC
  Filled 2017-08-07: qty 20

## 2017-08-07 MED ORDER — PROPOFOL 500 MG/50ML IV EMUL
INTRAVENOUS | Status: DC | PRN
  Administered 2017-08-07: 150 ug/kg/min via INTRAVENOUS

## 2017-08-07 MED ORDER — SODIUM CHLORIDE 0.9 % IV SOLN: INTRAVENOUS | Status: DC

## 2017-08-07 SURGICAL SUPPLY — 22 items

## 2017-08-07 NOTE — Discharge Instructions (Signed)

## 2017-08-07 NOTE — Interval H&P Note (Signed)
History and Physical Interval Note:  08/07/2017 8:24 AM  Todd [name]  has presented today for surgery, with the diagnosis of screening  The various methods of treatment have been discussed with the patient and family. After consideration of risks, benefits and other options for treatment, the patient has consented to  Procedure(s): COLONOSCOPY WITH PROPOFOL (N/A) as a surgical intervention .  The patient's history has been reviewed, patient examined, no change in status, stable for surgery.  I have reviewed the patient's chart and labs.  Questions were answered to the patient's satisfaction.     [place] C.

## 2017-08-07 NOTE — Anesthesia Preprocedure Evaluation (Signed)
Anesthesia Evaluation  Patient identified by MRN, date of birth, ID band Patient awake    Reviewed: Allergy & Precautions, NPO status , Patient's Chart, lab work & pertinent test results  Airway Mallampati: II  TM Distance: >3 FB Neck ROM: Full    Dental  (+) Dental Advisory Given, Edentulous Lower, Upper Dentures   Pulmonary neg pulmonary ROS,    Pulmonary exam normal breath sounds clear to auscultation       Cardiovascular hypertension, Pt. on medications Normal cardiovascular exam Rhythm:Regular Rate:Normal     Neuro/Psych negative neurological ROS  negative psych ROS   GI/Hepatic Neg liver ROS, GERD  Medicated,  Endo/Other  Morbid obesity  Renal/GU negative Renal ROS     Musculoskeletal  (+) Arthritis , Osteoarthritis,    Abdominal   Peds  Hematology negative hematology ROS (+)   Anesthesia Other Findings Day of surgery medications reviewed with the patient.  Reproductive/Obstetrics                             Anesthesia Physical Anesthesia Plan  ASA: IV  Anesthesia Plan: MAC   Post-op Pain Management:    Induction: Intravenous  PONV Risk Score and Plan: 1 and Propofol infusion, Treatment may vary due to age or medical condition and Ondansetron  Airway Management Planned: Nasal Cannula  Additional Equipment:   Intra-op Plan:   Post-operative Plan:   Informed Consent: I have reviewed the patients History and Physical, chart, labs and discussed the procedure including the risks, benefits and alternatives for the proposed anesthesia with the patient or authorized representative who has indicated his/her understanding and acceptance.   Dental advisory given  Plan Discussed with: CRNA and Anesthesiologist  Anesthesia Plan Comments: (Discussed risks/benefits/alternatives to MAC sedation including need for ventilatory support, hypotension, need for conversion to general  anesthesia.  All patient questions answered.  Patient/guardian wishes to proceed.)        Anesthesia Quick Evaluation

## 2017-08-07 NOTE — Transfer of Care (Signed)
Immediate Anesthesia Transfer of Care Note  Patient: Todd [name]  Procedure(s) Performed: COLONOSCOPY WITH PROPOFOL (N/A )  Patient Location: PACU  Anesthesia Type:MAC  Level of Consciousness: awake, alert  and oriented  Airway & Oxygen Therapy: Patient Spontanous Breathing  Post-op Assessment: Report given to RN and Post -op Vital signs reviewed and stable  Post vital signs: Reviewed and stable  Last Vitals:  Vitals:   08/07/17 0804  BP: (!) 154/82  Pulse: 76  Resp: (!) 29  Temp: 36.6 C  SpO2: 99%    Last Pain:  Vitals:   08/07/17 0804  TempSrc: Oral         Complications: No apparent anesthesia complications

## 2017-08-07 NOTE — Op Note (Signed)
[facility] Patient Name: Todd [name] Procedure Date: 08/07/2017 MRN: [id number] Attending MD: Lear Ng , MD Date of Birth: 02-20-1966 CSN: [id number] Age: 51 Admit Type: Outpatient Procedure:                Colonoscopy Indications:              Screening for colorectal malignant neoplasm, This                            is the patient's first colonoscopy Providers:                Lear Ng, MD, Zenon Mayo, RN, Tinnie Gens, Technician, Stephanie [place] CRNA Referring MD:              Medicines:                Propofol per Anesthesia, Monitored Anesthesia Care Complications:            No immediate complications. Estimated Blood Loss:     Estimated blood loss: none. Procedure:                Pre-Anesthesia Assessment:                           - Prior to the procedure, a History and Physical                            was performed, and patient medications and                            allergies were reviewed. The patient's tolerance of                            previous anesthesia was also reviewed. The risks                            and benefits of the procedure and the sedation                            options and risks were discussed with the patient.                            All questions were answered, and informed consent                            was obtained. Prior Anticoagulants: The patient has                            taken no previous anticoagulant or antiplatelet                            agents. ASA Grade Assessment: IV - A patient with  severe systemic disease that is a constant threat                            to life. After reviewing the risks and benefits,                            the patient was deemed in satisfactory condition to                            undergo the procedure.                           After obtaining informed consent, the colonoscope                           was passed under direct vision. Throughout the                            procedure, the patient's blood pressure, pulse, and                            oxygen saturations were monitored continuously. The                            EC-[id number] ([id number]) scope was introduced through                            the anus and advanced to the the cecum, identified                            by appendiceal orifice and ileocecal valve. The                            colonoscopy was performed without difficulty. The                            patient tolerated the procedure well. The quality                            of the bowel preparation was adequate and good. The                            terminal ileum, ileocecal valve, appendiceal                            orifice, and rectum were photographed. Scope In: 9:36:26 AM Scope Out: 9:53:42 AM Scope Withdrawal Time: 0 hours 8 minutes 10 seconds  Total Procedure Duration: 0 hours 17 minutes 16 seconds  Findings:      The perianal and digital rectal examinations were normal.      A 14 mm polyp was found in the proximal ascending colon. The polyp was       sessile. The polyp was removed with a hot snare. Resection and retrieval       were complete. Estimated blood loss:  none.      A 10 mm polyp was found in the rectum. The polyp was sessile. The polyp       was removed with a hot snare. Resection and retrieval were complete.       Estimated blood loss: none.      Scattered small and large-mouthed diverticula were found in the sigmoid       colon.      Internal hemorrhoids were found during retroflexion. The hemorrhoids       were small and Grade I (internal hemorrhoids that do not prolapse). Impression:               - One 14 mm polyp in the proximal ascending colon,                            removed with a hot snare. Resected and retrieved.                           - One 10 mm polyp in the rectum, removed with a hot                             snare. Resected and retrieved.                           - Diverticulosis in the sigmoid colon.                           - Internal hemorrhoids. Moderate Sedation:      N/A- Per Anesthesia Care Recommendation:           - Await pathology results.                           - Patient has a contact number available for                            emergencies. The signs and symptoms of potential                            delayed complications were discussed with the                            patient. Return to normal activities tomorrow.                            Written discharge instructions were provided to the                            patient.                           - Repeat colonoscopy for surveillance based on                            pathology results.                           -  No aspirin, ibuprofen, naproxen, or other                            non-steroidal anti-inflammatory drugs for 2 weeks.                           - High fiber diet. Procedure Code(s):        --- Professional ---                           [phone], Colonoscopy, flexible; with removal of                            tumor(s), polyp(s), or other lesion(s) by snare                            technique Diagnosis Code(s):        --- Professional ---                           Z12.11, Encounter for screening for malignant                            neoplasm of colon                           D12.2, Benign neoplasm of ascending colon                           K62.1, Rectal polyp                           K64.0, First degree hemorrhoids                           K57.30, Diverticulosis of large intestine without                            perforation or abscess without bleeding CPT copyright 2016 American Medical Association. All rights reserved. The codes documented in this report are preliminary and upon coder review may  be revised to meet current compliance requirements. Lear Ng, MD 08/07/2017 10:03:56 AM This report has been signed electronically. Number of Addenda: 0

## 2017-08-07 NOTE — Anesthesia Postprocedure Evaluation (Signed)
Anesthesia Post Note  Patient: Todd [name]  Procedure(s) Performed: COLONOSCOPY WITH PROPOFOL (N/A )     Patient location during evaluation: Endoscopy Anesthesia Type: MAC Level of consciousness: awake and alert Pain management: pain level controlled Vital Signs Assessment: post-procedure vital signs reviewed and stable Respiratory status: spontaneous breathing, nonlabored ventilation and respiratory function stable Cardiovascular status: stable and blood pressure returned to baseline Postop Assessment: no apparent nausea or vomiting Anesthetic complications: no Comments: No antiemetics given due to MAC procedure, and no patient complaint of nausea/vomiting.     Last Vitals:  Vitals:   08/07/17 1013 08/07/17 1015  BP: (!) 172/84   Pulse: 62 61  Resp: (!) 28 18  Temp:    SpO2: 97% 97%    Last Pain:  Vitals:   08/07/17 0804  TempSrc: Oral                 Catalina Gravel

## 2017-08-07 NOTE — H&P (Signed)
Date of Initial H&P: 07/09/17  History reviewed, patient examined, no change in status, stable for surgery.

## 2017-08-08 ENCOUNTER — Encounter (HOSPITAL_COMMUNITY): Payer: Self-pay | Admitting: Gastroenterology

## 2017-12-06 DIAGNOSIS — Z6841 Body Mass Index (BMI) 40.0 and over, adult: Secondary | ICD-10-CM | POA: Diagnosis not present

## 2017-12-06 DIAGNOSIS — R12 Heartburn: Secondary | ICD-10-CM | POA: Diagnosis not present

## 2017-12-06 DIAGNOSIS — K219 Gastro-esophageal reflux disease without esophagitis: Secondary | ICD-10-CM | POA: Diagnosis not present

## 2017-12-19 DIAGNOSIS — Z6841 Body Mass Index (BMI) 40.0 and over, adult: Secondary | ICD-10-CM | POA: Diagnosis not present

## 2017-12-19 DIAGNOSIS — Z713 Dietary counseling and surveillance: Secondary | ICD-10-CM | POA: Diagnosis not present

## 2017-12-25 DIAGNOSIS — K449 Diaphragmatic hernia without obstruction or gangrene: Secondary | ICD-10-CM | POA: Diagnosis not present

## 2017-12-25 DIAGNOSIS — Z01818 Encounter for other preprocedural examination: Secondary | ICD-10-CM | POA: Diagnosis not present

## 2018-01-02 DIAGNOSIS — G4730 Sleep apnea, unspecified: Secondary | ICD-10-CM | POA: Diagnosis not present

## 2018-01-22 DIAGNOSIS — Z6841 Body Mass Index (BMI) 40.0 and over, adult: Secondary | ICD-10-CM | POA: Diagnosis not present

## 2018-01-22 DIAGNOSIS — Z713 Dietary counseling and surveillance: Secondary | ICD-10-CM | POA: Diagnosis not present

## 2018-01-31 DIAGNOSIS — Z6841 Body Mass Index (BMI) 40.0 and over, adult: Secondary | ICD-10-CM | POA: Diagnosis not present

## 2018-01-31 DIAGNOSIS — G4730 Sleep apnea, unspecified: Secondary | ICD-10-CM | POA: Diagnosis not present

## 2018-01-31 DIAGNOSIS — R12 Heartburn: Secondary | ICD-10-CM | POA: Diagnosis not present

## 2018-02-28 DIAGNOSIS — I10 Essential (primary) hypertension: Secondary | ICD-10-CM | POA: Diagnosis not present

## 2018-02-28 DIAGNOSIS — Z713 Dietary counseling and surveillance: Secondary | ICD-10-CM | POA: Diagnosis not present

## 2018-02-28 DIAGNOSIS — Z6841 Body Mass Index (BMI) 40.0 and over, adult: Secondary | ICD-10-CM | POA: Diagnosis not present

## 2018-02-28 DIAGNOSIS — M170 Bilateral primary osteoarthritis of knee: Secondary | ICD-10-CM | POA: Diagnosis not present

## 2018-02-28 DIAGNOSIS — R12 Heartburn: Secondary | ICD-10-CM | POA: Diagnosis not present

## 2018-02-28 DIAGNOSIS — G4730 Sleep apnea, unspecified: Secondary | ICD-10-CM | POA: Diagnosis not present

## 2018-03-27 DIAGNOSIS — M2560 Stiffness of unspecified joint, not elsewhere classified: Secondary | ICD-10-CM | POA: Diagnosis not present

## 2018-03-27 DIAGNOSIS — M545 Low back pain: Secondary | ICD-10-CM | POA: Diagnosis not present

## 2018-03-27 DIAGNOSIS — M9903 Segmental and somatic dysfunction of lumbar region: Secondary | ICD-10-CM | POA: Diagnosis not present

## 2018-03-27 DIAGNOSIS — R293 Abnormal posture: Secondary | ICD-10-CM | POA: Diagnosis not present

## 2018-03-28 DIAGNOSIS — Z6841 Body Mass Index (BMI) 40.0 and over, adult: Secondary | ICD-10-CM | POA: Diagnosis not present

## 2018-03-28 DIAGNOSIS — I10 Essential (primary) hypertension: Secondary | ICD-10-CM | POA: Diagnosis not present

## 2018-03-28 DIAGNOSIS — M170 Bilateral primary osteoarthritis of knee: Secondary | ICD-10-CM | POA: Diagnosis not present

## 2018-04-01 DIAGNOSIS — M9903 Segmental and somatic dysfunction of lumbar region: Secondary | ICD-10-CM | POA: Diagnosis not present

## 2018-04-01 DIAGNOSIS — M2560 Stiffness of unspecified joint, not elsewhere classified: Secondary | ICD-10-CM | POA: Diagnosis not present

## 2018-04-01 DIAGNOSIS — M545 Low back pain: Secondary | ICD-10-CM | POA: Diagnosis not present

## 2018-04-01 DIAGNOSIS — R293 Abnormal posture: Secondary | ICD-10-CM | POA: Diagnosis not present

## 2018-05-03 DIAGNOSIS — G4733 Obstructive sleep apnea (adult) (pediatric): Secondary | ICD-10-CM | POA: Diagnosis not present

## 2018-05-03 DIAGNOSIS — E669 Obesity, unspecified: Secondary | ICD-10-CM | POA: Diagnosis not present

## 2018-05-03 DIAGNOSIS — Z6841 Body Mass Index (BMI) 40.0 and over, adult: Secondary | ICD-10-CM | POA: Diagnosis not present

## 2018-05-03 DIAGNOSIS — R030 Elevated blood-pressure reading, without diagnosis of hypertension: Secondary | ICD-10-CM | POA: Diagnosis not present

## 2018-05-03 DIAGNOSIS — Z713 Dietary counseling and surveillance: Secondary | ICD-10-CM | POA: Diagnosis not present

## 2018-05-03 DIAGNOSIS — Z13220 Encounter for screening for lipoid disorders: Secondary | ICD-10-CM | POA: Diagnosis not present

## 2018-05-03 DIAGNOSIS — R5383 Other fatigue: Secondary | ICD-10-CM | POA: Diagnosis not present

## 2018-05-23 DIAGNOSIS — R030 Elevated blood-pressure reading, without diagnosis of hypertension: Secondary | ICD-10-CM | POA: Diagnosis not present

## 2018-05-23 DIAGNOSIS — G4733 Obstructive sleep apnea (adult) (pediatric): Secondary | ICD-10-CM | POA: Diagnosis not present

## 2018-05-23 DIAGNOSIS — R400 Somnolence: Secondary | ICD-10-CM | POA: Diagnosis not present

## 2018-05-23 DIAGNOSIS — R635 Abnormal weight gain: Secondary | ICD-10-CM | POA: Diagnosis not present

## 2018-05-27 DIAGNOSIS — F4320 Adjustment disorder, unspecified: Secondary | ICD-10-CM | POA: Diagnosis not present

## 2018-05-28 DIAGNOSIS — I10 Essential (primary) hypertension: Secondary | ICD-10-CM | POA: Diagnosis not present

## 2018-05-28 DIAGNOSIS — Z6841 Body Mass Index (BMI) 40.0 and over, adult: Secondary | ICD-10-CM | POA: Diagnosis not present

## 2018-05-28 DIAGNOSIS — G4733 Obstructive sleep apnea (adult) (pediatric): Secondary | ICD-10-CM | POA: Diagnosis not present

## 2018-05-28 DIAGNOSIS — R635 Abnormal weight gain: Secondary | ICD-10-CM | POA: Diagnosis not present

## 2018-06-24 DIAGNOSIS — I10 Essential (primary) hypertension: Secondary | ICD-10-CM | POA: Diagnosis not present

## 2018-06-24 DIAGNOSIS — Z0000 Encounter for general adult medical examination without abnormal findings: Secondary | ICD-10-CM | POA: Diagnosis not present

## 2018-06-24 DIAGNOSIS — Z125 Encounter for screening for malignant neoplasm of prostate: Secondary | ICD-10-CM | POA: Diagnosis not present

## 2018-07-04 DIAGNOSIS — Z713 Dietary counseling and surveillance: Secondary | ICD-10-CM | POA: Diagnosis not present

## 2018-07-04 DIAGNOSIS — Z6841 Body Mass Index (BMI) 40.0 and over, adult: Secondary | ICD-10-CM | POA: Diagnosis not present

## 2018-07-04 DIAGNOSIS — Z9989 Dependence on other enabling machines and devices: Secondary | ICD-10-CM | POA: Diagnosis not present

## 2018-07-04 DIAGNOSIS — I10 Essential (primary) hypertension: Secondary | ICD-10-CM | POA: Diagnosis not present

## 2018-07-04 DIAGNOSIS — G4733 Obstructive sleep apnea (adult) (pediatric): Secondary | ICD-10-CM | POA: Diagnosis not present

## 2018-07-30 DIAGNOSIS — Z713 Dietary counseling and surveillance: Secondary | ICD-10-CM | POA: Diagnosis not present

## 2018-07-30 DIAGNOSIS — R030 Elevated blood-pressure reading, without diagnosis of hypertension: Secondary | ICD-10-CM | POA: Diagnosis not present

## 2018-07-30 DIAGNOSIS — G4733 Obstructive sleep apnea (adult) (pediatric): Secondary | ICD-10-CM | POA: Diagnosis not present

## 2018-07-30 DIAGNOSIS — Z6841 Body Mass Index (BMI) 40.0 and over, adult: Secondary | ICD-10-CM | POA: Diagnosis not present

## 2018-08-05 DIAGNOSIS — G4733 Obstructive sleep apnea (adult) (pediatric): Secondary | ICD-10-CM | POA: Diagnosis not present

## 2018-09-05 DIAGNOSIS — Z01812 Encounter for preprocedural laboratory examination: Secondary | ICD-10-CM | POA: Diagnosis not present

## 2018-09-05 DIAGNOSIS — Z01818 Encounter for other preprocedural examination: Secondary | ICD-10-CM | POA: Diagnosis not present

## 2018-09-05 DIAGNOSIS — I10 Essential (primary) hypertension: Secondary | ICD-10-CM | POA: Diagnosis not present

## 2018-09-05 DIAGNOSIS — Z1159 Encounter for screening for other viral diseases: Secondary | ICD-10-CM | POA: Diagnosis not present

## 2018-09-05 DIAGNOSIS — Z6841 Body Mass Index (BMI) 40.0 and over, adult: Secondary | ICD-10-CM | POA: Diagnosis not present

## 2018-09-05 DIAGNOSIS — Z112 Encounter for screening for other bacterial diseases: Secondary | ICD-10-CM | POA: Diagnosis not present

## 2018-09-05 DIAGNOSIS — G4733 Obstructive sleep apnea (adult) (pediatric): Secondary | ICD-10-CM | POA: Diagnosis not present

## 2018-09-11 ENCOUNTER — Other Ambulatory Visit: Payer: Self-pay | Admitting: Chiropractic Medicine

## 2018-09-11 ENCOUNTER — Ambulatory Visit
Admission: RE | Admit: 2018-09-11 | Discharge: 2018-09-11 | Disposition: A | Discharge status: Home / Self Care | Payer: BLUE CROSS/BLUE SHIELD | Source: Ambulatory Visit | Attending: Chiropractic Medicine | Admitting: Chiropractic Medicine

## 2018-09-11 DIAGNOSIS — M25562 Pain in left knee: Principal | ICD-10-CM

## 2018-09-11 DIAGNOSIS — M1711 Unilateral primary osteoarthritis, right knee: Secondary | ICD-10-CM | POA: Diagnosis not present

## 2018-09-11 DIAGNOSIS — M25561 Pain in right knee: Secondary | ICD-10-CM

## 2018-09-11 DIAGNOSIS — M1712 Unilateral primary osteoarthritis, left knee: Secondary | ICD-10-CM | POA: Diagnosis not present

## 2018-10-15 DIAGNOSIS — Z6841 Body Mass Index (BMI) 40.0 and over, adult: Secondary | ICD-10-CM | POA: Diagnosis not present

## 2018-10-15 DIAGNOSIS — Z713 Dietary counseling and surveillance: Secondary | ICD-10-CM | POA: Diagnosis not present

## 2018-10-15 DIAGNOSIS — Z01818 Encounter for other preprocedural examination: Secondary | ICD-10-CM | POA: Diagnosis not present

## 2018-10-24 DIAGNOSIS — Z01818 Encounter for other preprocedural examination: Secondary | ICD-10-CM | POA: Diagnosis not present

## 2018-10-24 DIAGNOSIS — Z6841 Body Mass Index (BMI) 40.0 and over, adult: Secondary | ICD-10-CM | POA: Diagnosis not present

## 2018-10-28 DIAGNOSIS — Z9884 Bariatric surgery status: Secondary | ICD-10-CM | POA: Diagnosis not present

## 2018-10-28 DIAGNOSIS — K449 Diaphragmatic hernia without obstruction or gangrene: Secondary | ICD-10-CM | POA: Diagnosis not present

## 2018-10-28 DIAGNOSIS — R400 Somnolence: Secondary | ICD-10-CM | POA: Diagnosis not present

## 2018-10-28 DIAGNOSIS — I10 Essential (primary) hypertension: Secondary | ICD-10-CM | POA: Diagnosis not present

## 2018-10-28 DIAGNOSIS — K2950 Unspecified chronic gastritis without bleeding: Secondary | ICD-10-CM | POA: Diagnosis not present

## 2018-10-28 DIAGNOSIS — G4733 Obstructive sleep apnea (adult) (pediatric): Secondary | ICD-10-CM | POA: Diagnosis not present

## 2018-10-28 DIAGNOSIS — Z6841 Body Mass Index (BMI) 40.0 and over, adult: Secondary | ICD-10-CM | POA: Diagnosis not present

## 2018-10-28 DIAGNOSIS — K219 Gastro-esophageal reflux disease without esophagitis: Secondary | ICD-10-CM | POA: Diagnosis not present

## 2018-11-14 DIAGNOSIS — Z9884 Bariatric surgery status: Secondary | ICD-10-CM | POA: Diagnosis not present

## 2018-11-21 DIAGNOSIS — Z713 Dietary counseling and surveillance: Secondary | ICD-10-CM | POA: Diagnosis not present

## 2018-11-21 DIAGNOSIS — Z9884 Bariatric surgery status: Secondary | ICD-10-CM | POA: Diagnosis not present

## 2018-11-22 DIAGNOSIS — R748 Abnormal levels of other serum enzymes: Secondary | ICD-10-CM | POA: Diagnosis not present

## 2018-12-09 DIAGNOSIS — R748 Abnormal levels of other serum enzymes: Secondary | ICD-10-CM | POA: Diagnosis not present

## 2018-12-10 DIAGNOSIS — Z9884 Bariatric surgery status: Secondary | ICD-10-CM | POA: Diagnosis not present

## 2018-12-10 DIAGNOSIS — Z7901 Long term (current) use of anticoagulants: Secondary | ICD-10-CM | POA: Diagnosis not present

## 2018-12-10 DIAGNOSIS — Z5181 Encounter for therapeutic drug level monitoring: Secondary | ICD-10-CM | POA: Diagnosis not present

## 2019-02-21 DIAGNOSIS — Z9884 Bariatric surgery status: Secondary | ICD-10-CM | POA: Diagnosis not present

## 2019-02-21 DIAGNOSIS — Z713 Dietary counseling and surveillance: Secondary | ICD-10-CM | POA: Diagnosis not present

## 2019-04-24 DIAGNOSIS — E43 Unspecified severe protein-calorie malnutrition: Secondary | ICD-10-CM | POA: Diagnosis not present

## 2019-04-24 DIAGNOSIS — Z9884 Bariatric surgery status: Secondary | ICD-10-CM | POA: Diagnosis not present

## 2019-04-28 DIAGNOSIS — E43 Unspecified severe protein-calorie malnutrition: Secondary | ICD-10-CM | POA: Diagnosis not present

## 2019-04-28 DIAGNOSIS — Z13220 Encounter for screening for lipoid disorders: Secondary | ICD-10-CM | POA: Diagnosis not present

## 2019-04-28 DIAGNOSIS — R5383 Other fatigue: Secondary | ICD-10-CM | POA: Diagnosis not present

## 2019-05-19 DIAGNOSIS — Z9884 Bariatric surgery status: Secondary | ICD-10-CM | POA: Diagnosis not present

## 2019-05-19 DIAGNOSIS — F4329 Adjustment disorder with other symptoms: Secondary | ICD-10-CM | POA: Diagnosis not present

## 2019-07-21 DIAGNOSIS — I10 Essential (primary) hypertension: Secondary | ICD-10-CM | POA: Diagnosis not present

## 2019-07-21 DIAGNOSIS — Z13220 Encounter for screening for lipoid disorders: Secondary | ICD-10-CM | POA: Diagnosis not present

## 2019-07-21 DIAGNOSIS — Z125 Encounter for screening for malignant neoplasm of prostate: Secondary | ICD-10-CM | POA: Diagnosis not present

## 2019-07-21 DIAGNOSIS — Z0000 Encounter for general adult medical examination without abnormal findings: Secondary | ICD-10-CM | POA: Diagnosis not present

## 2020-06-07 IMAGING — CR DG KNEE AP/LAT W/ SUNRISE*L*
3 series · 3 of 3 positions shown · non-contrast
Comparison: None.

CLINICAL DATA: Bilateral knee pain, chronic. No reported injury.

EXAM:
LEFT KNEE 3 VIEWS

[w knee ap left]
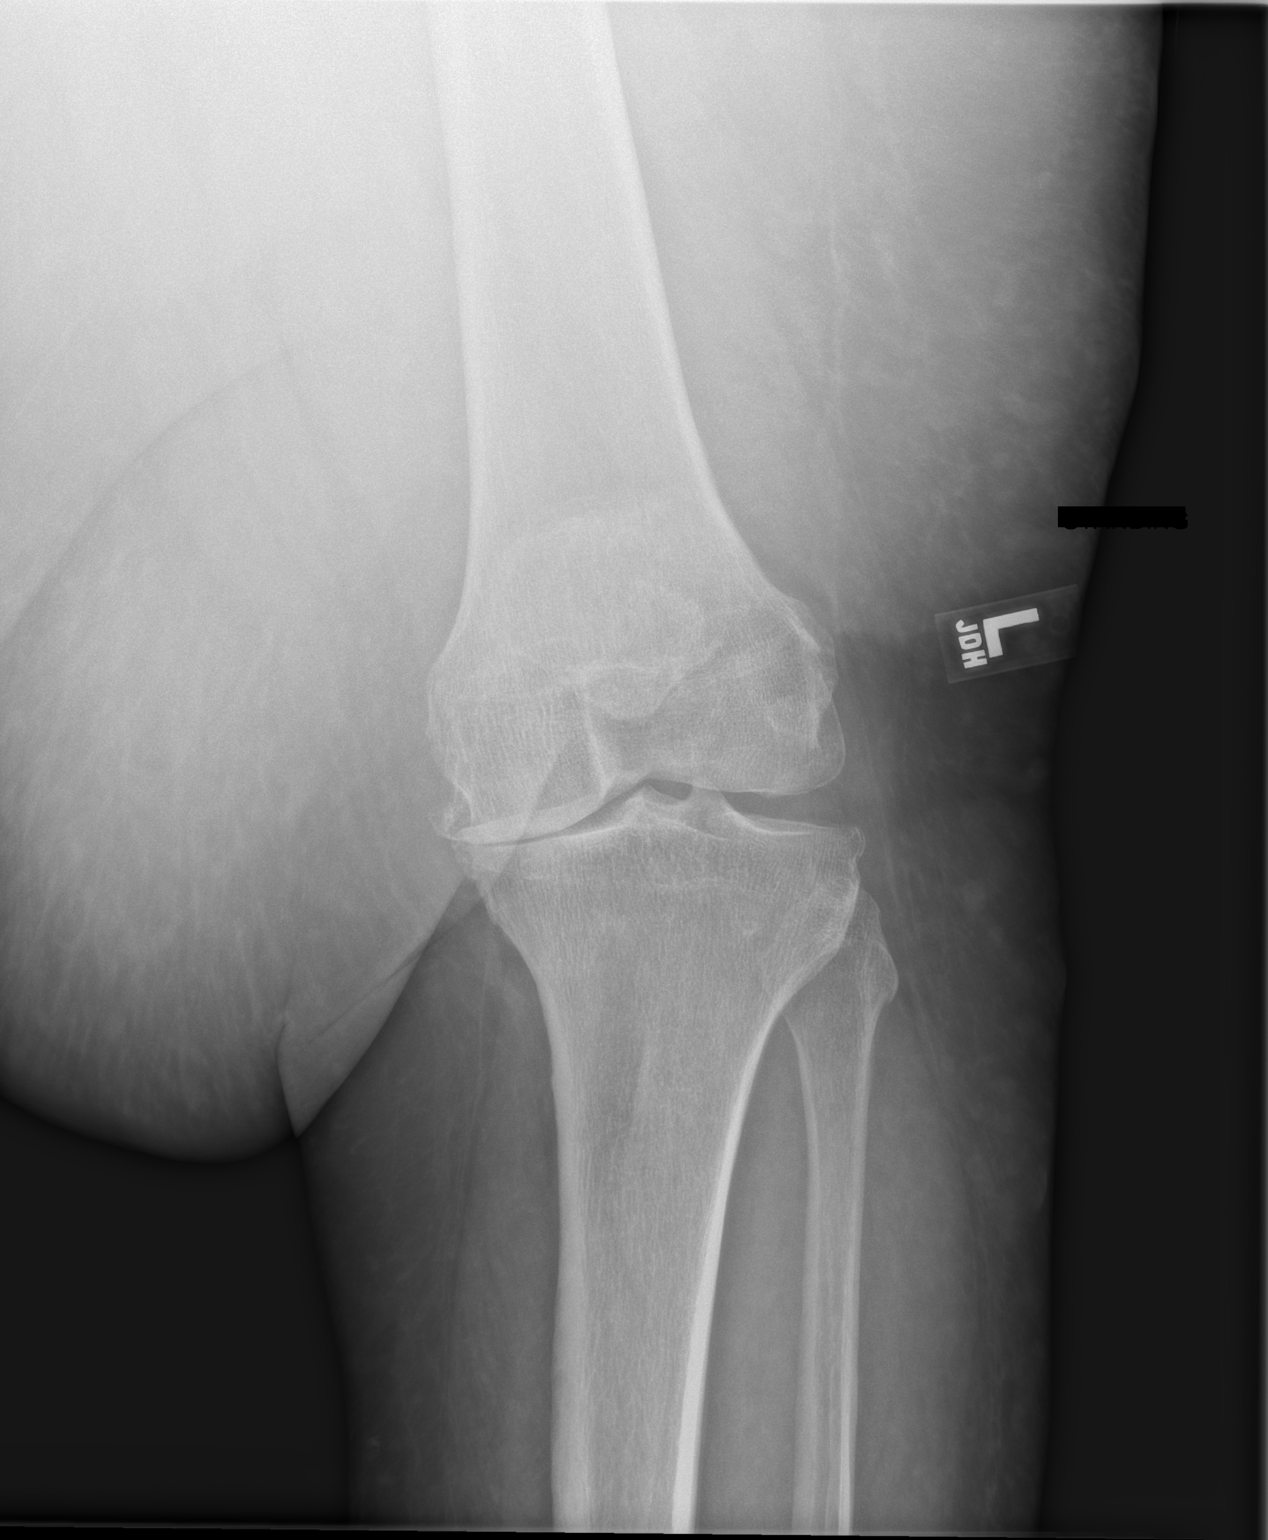

[w knee lat left]
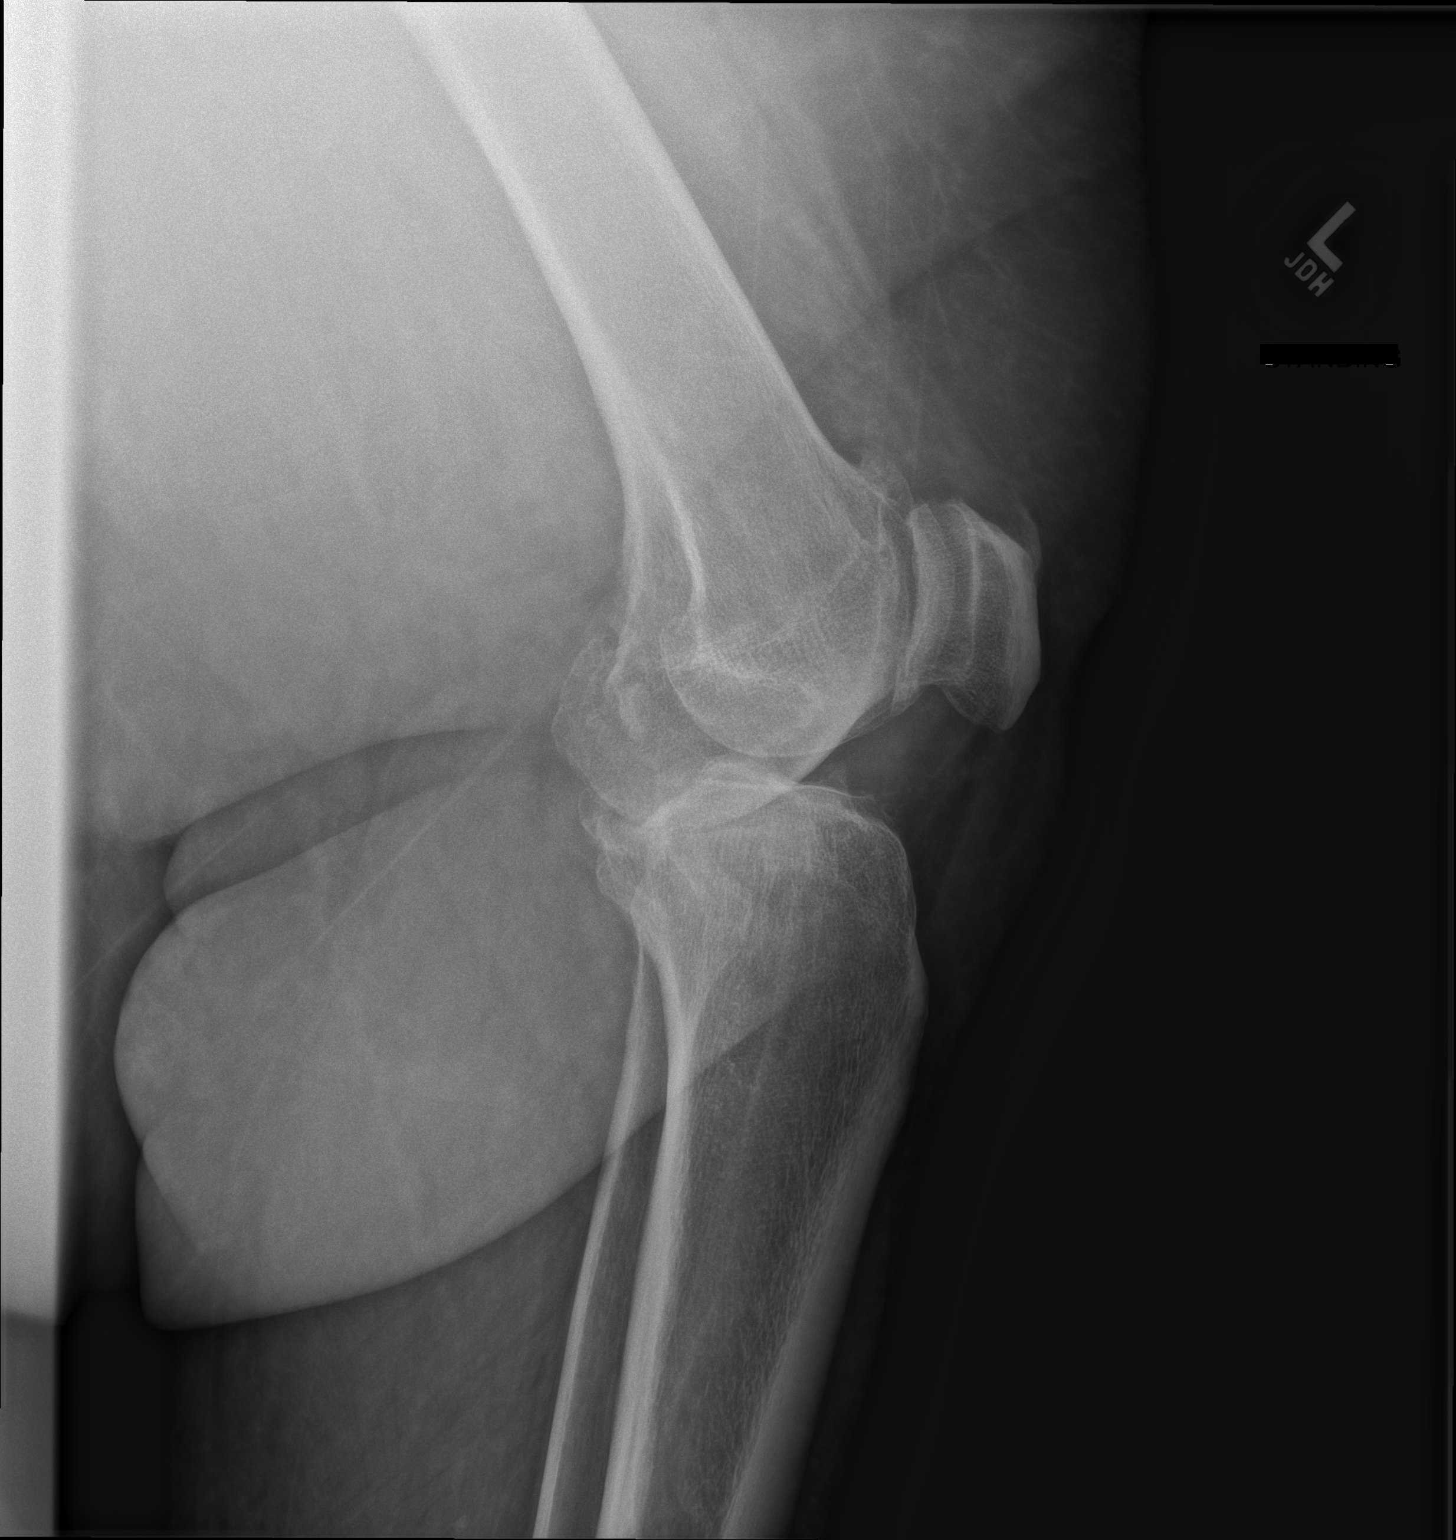

[x knee sunrise left]
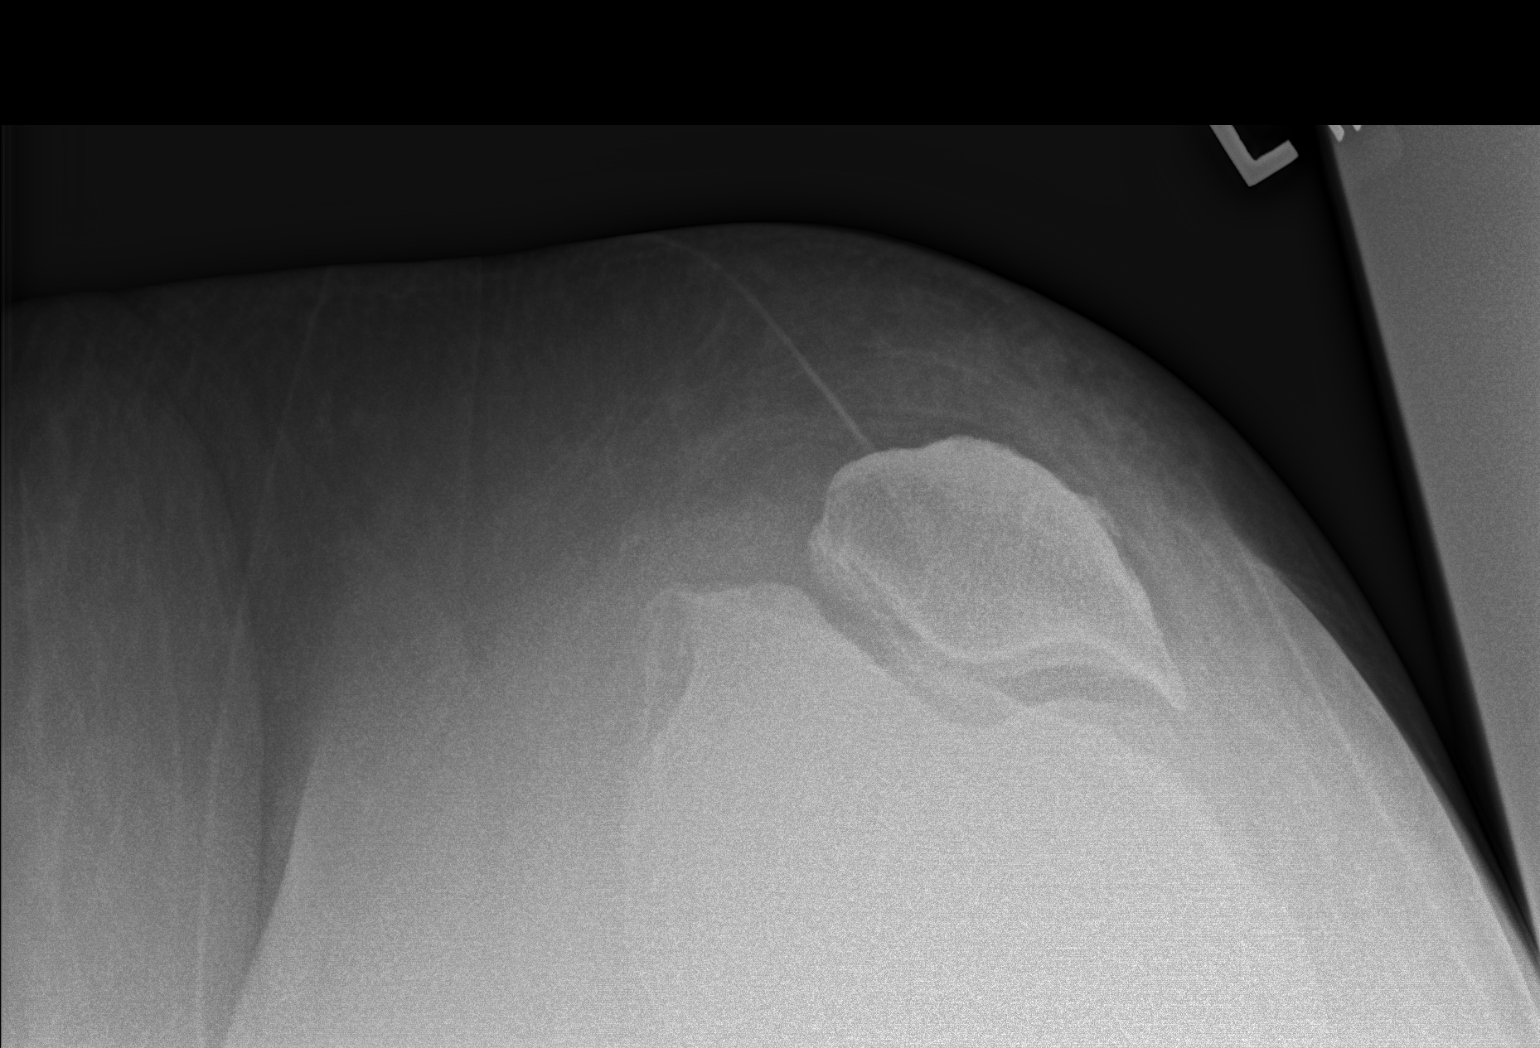

[3 of 3 positions shown; findings below may reference images not displayed]

FINDINGS: No fracture, joint effusion or dislocation. No suspicious focal
osseous lesion. Severe medial compartment osteoarthritis with near
complete loss of joint space. Mild-to-moderate patellofemoral
compartment osteoarthritis. Relatively preserved lateral
compartment. No radiopaque foreign body.
IMPRESSION: Severe medial compartment and mild-to-moderate patellofemoral
compartment osteoarthritis in the left knee.

## 2020-07-26 DIAGNOSIS — Z0000 Encounter for general adult medical examination without abnormal findings: Secondary | ICD-10-CM | POA: Diagnosis not present

## 2020-07-26 DIAGNOSIS — Z125 Encounter for screening for malignant neoplasm of prostate: Secondary | ICD-10-CM | POA: Diagnosis not present

## 2020-07-26 DIAGNOSIS — Z13220 Encounter for screening for lipoid disorders: Secondary | ICD-10-CM | POA: Diagnosis not present

## 2020-08-30 ENCOUNTER — Other Ambulatory Visit: Payer: Self-pay

## 2020-08-30 ENCOUNTER — Emergency Department (HOSPITAL_COMMUNITY): Payer: BC Managed Care – PPO

## 2020-08-30 ENCOUNTER — Encounter (HOSPITAL_COMMUNITY): Payer: Self-pay

## 2020-08-30 ENCOUNTER — Inpatient Hospital Stay (HOSPITAL_COMMUNITY)
Admission: EM | Admit: 2020-08-30 | Discharge: 2020-09-07 | DRG: 177 | Disposition: A | Discharge status: Home / Self Care | Payer: BC Managed Care – PPO | Attending: Internal Medicine | Admitting: Internal Medicine

## 2020-08-30 DIAGNOSIS — R7989 Other specified abnormal findings of blood chemistry: Secondary | ICD-10-CM | POA: Diagnosis not present

## 2020-08-30 DIAGNOSIS — U071 COVID-19: Principal | ICD-10-CM

## 2020-08-30 DIAGNOSIS — I5031 Acute diastolic (congestive) heart failure: Secondary | ICD-10-CM | POA: Diagnosis present

## 2020-08-30 DIAGNOSIS — R21 Rash and other nonspecific skin eruption: Secondary | ICD-10-CM | POA: Diagnosis not present

## 2020-08-30 DIAGNOSIS — Z79899 Other long term (current) drug therapy: Secondary | ICD-10-CM | POA: Diagnosis not present

## 2020-08-30 DIAGNOSIS — J1282 Pneumonia due to coronavirus disease 2019: Secondary | ICD-10-CM | POA: Diagnosis present

## 2020-08-30 DIAGNOSIS — E669 Obesity, unspecified: Secondary | ICD-10-CM | POA: Diagnosis present

## 2020-08-30 DIAGNOSIS — R0902 Hypoxemia: Secondary | ICD-10-CM

## 2020-08-30 DIAGNOSIS — Z1152 Encounter for screening for COVID-19: Secondary | ICD-10-CM | POA: Diagnosis not present

## 2020-08-30 DIAGNOSIS — B349 Viral infection, unspecified: Secondary | ICD-10-CM | POA: Diagnosis not present

## 2020-08-30 DIAGNOSIS — R040 Epistaxis: Secondary | ICD-10-CM | POA: Diagnosis not present

## 2020-08-30 DIAGNOSIS — Z881 Allergy status to other antibiotic agents status: Secondary | ICD-10-CM

## 2020-08-30 DIAGNOSIS — Z885 Allergy status to narcotic agent status: Secondary | ICD-10-CM | POA: Diagnosis not present

## 2020-08-30 DIAGNOSIS — K219 Gastro-esophageal reflux disease without esophagitis: Secondary | ICD-10-CM | POA: Diagnosis not present

## 2020-08-30 DIAGNOSIS — Z6838 Body mass index (BMI) 38.0-38.9, adult: Secondary | ICD-10-CM | POA: Diagnosis not present

## 2020-08-30 DIAGNOSIS — E860 Dehydration: Secondary | ICD-10-CM | POA: Diagnosis not present

## 2020-08-30 DIAGNOSIS — J9601 Acute respiratory failure with hypoxia: Secondary | ICD-10-CM | POA: Diagnosis not present

## 2020-08-30 DIAGNOSIS — D696 Thrombocytopenia, unspecified: Secondary | ICD-10-CM | POA: Diagnosis present

## 2020-08-30 DIAGNOSIS — Z9884 Bariatric surgery status: Secondary | ICD-10-CM

## 2020-08-30 DIAGNOSIS — I517 Cardiomegaly: Secondary | ICD-10-CM | POA: Diagnosis not present

## 2020-08-30 DIAGNOSIS — I110 Hypertensive heart disease with heart failure: Secondary | ICD-10-CM | POA: Diagnosis not present

## 2020-08-30 DIAGNOSIS — J440 Chronic obstructive pulmonary disease with acute lower respiratory infection: Secondary | ICD-10-CM | POA: Diagnosis present

## 2020-08-30 DIAGNOSIS — J189 Pneumonia, unspecified organism: Secondary | ICD-10-CM | POA: Diagnosis not present

## 2020-08-30 LAB — LACTIC ACID, PLASMA: Lactic Acid, Venous: 1.2 mmol/L (ref 0.5–1.9)

## 2020-08-30 LAB — BASIC METABOLIC PANEL
Anion gap: 11 (ref 5–15)
BUN: 15 mg/dL (ref 6–20)
CO2: 27 mmol/L (ref 22–32)
Calcium: 8.7 mg/dL — ABNORMAL LOW (ref 8.9–10.3)
Chloride: 99 mmol/L (ref 98–111)
Creatinine, Ser: 1.16 mg/dL (ref 0.61–1.24)
GFR, Estimated: >60 mL/min (ref >60)
Glucose, Bld: 130 mg/dL — ABNORMAL HIGH (ref 70–99)
Potassium: 3.8 mmol/L (ref 3.5–5.1)
Sodium: 137 mmol/L (ref 135–145)

## 2020-08-30 LAB — CBC
HCT: 43.5 % (ref 39.0–52.0)
Hemoglobin: 14.2 g/dL (ref 13.0–17.0)
MCH: 27.7 pg (ref 26.0–34.0)
MCHC: 32.6 g/dL (ref 30.0–36.0)
MCV: 84.8 fL (ref 80.0–100.0)
Platelets: 96 10*3/uL — ABNORMAL LOW (ref 150–400)
RBC: 5.13 MIL/uL (ref 4.22–5.81)
RDW: 13.8 % (ref 11.5–15.5)
WBC: 9.1 10*3/uL (ref 4.0–10.5)
nRBC: 0 % (ref 0.0–0.2)

## 2020-08-30 LAB — TRIGLYCERIDES: Triglycerides: 64 mg/dL (ref <150)

## 2020-08-30 LAB — CK: Total CK: 147 U/L (ref 49–397)

## 2020-08-30 LAB — HEPATIC FUNCTION PANEL
ALT: 29 U/L (ref 0–44)
AST: 48 U/L — ABNORMAL HIGH (ref 15–41)
Albumin: 3 g/dL — ABNORMAL LOW (ref 3.5–5.0)
Alkaline Phosphatase: 55 U/L (ref 38–126)
Bilirubin, Direct: 0.4 mg/dL — ABNORMAL HIGH (ref 0.0–0.2)
Indirect Bilirubin: 1 mg/dL — ABNORMAL HIGH (ref 0.3–0.9)
Total Bilirubin: 1.4 mg/dL — ABNORMAL HIGH (ref 0.3–1.2)
Total Protein: 7 g/dL (ref 6.5–8.1)

## 2020-08-30 LAB — RESP PANEL BY RT-PCR (FLU A&B, COVID) ARPGX2
Influenza A by PCR: NEGATIVE
Influenza B by PCR: NEGATIVE
SARS Coronavirus 2 by RT PCR: POSITIVE — AB

## 2020-08-30 LAB — CBG MONITORING, ED
Glucose-Capillary: 108 mg/dL — ABNORMAL HIGH (ref 70–99)
Glucose-Capillary: 110 mg/dL — ABNORMAL HIGH (ref 70–99)

## 2020-08-30 LAB — PROCALCITONIN: Procalcitonin: 0.44 ng/mL

## 2020-08-30 LAB — TROPONIN I (HIGH SENSITIVITY)
Troponin I (High Sensitivity): 60 ng/L — ABNORMAL HIGH (ref <18)
Troponin I (High Sensitivity): 54 ng/L — ABNORMAL HIGH (ref <18)

## 2020-08-30 LAB — D-DIMER, QUANTITATIVE: D-Dimer, Quant: 1.17 ug/mL-FEU — ABNORMAL HIGH (ref 0.00–0.50)

## 2020-08-30 LAB — FIBRINOGEN: Fibrinogen: 708 mg/dL — ABNORMAL HIGH (ref 210–475)

## 2020-08-30 LAB — FERRITIN: Ferritin: 908 ng/mL — ABNORMAL HIGH (ref 24–336)

## 2020-08-30 LAB — C-REACTIVE PROTEIN: CRP: 21 mg/dL — ABNORMAL HIGH (ref <1.0)

## 2020-08-30 LAB — LACTATE DEHYDROGENASE: LDH: 451 U/L — ABNORMAL HIGH (ref 98–192)

## 2020-08-30 MED ORDER — ONDANSETRON HCL 4 MG PO TABS: 4.0000 mg | ORAL_TABLET | Freq: Four times a day (QID) | ORAL | Status: DC | PRN

## 2020-08-30 MED ORDER — PANTOPRAZOLE SODIUM 40 MG PO TBEC
40.0000 mg | DELAYED_RELEASE_TABLET | Freq: Every day | ORAL | Status: DC
  Administered 2020-08-30 – 2020-09-07 (×9): 40 mg via ORAL
  Filled 2020-08-30 (×9): qty 1

## 2020-08-30 MED ORDER — INSULIN ASPART 100 UNIT/ML ~~LOC~~ SOLN
0.0000 [IU] | Freq: Three times a day (TID) | SUBCUTANEOUS | Status: DC
  Administered 2020-08-31 – 2020-09-04 (×5): 2 [IU] via SUBCUTANEOUS

## 2020-08-30 MED ORDER — GUAIFENESIN-DM 100-10 MG/5ML PO SYRP
10.0000 mL | ORAL_SOLUTION | ORAL | Status: DC | PRN
Start: 2020-08-30 — End: 2020-09-07
  Administered 2020-09-02: 23:00:00 10 mL via ORAL
  Filled 2020-08-30: qty 10

## 2020-08-30 MED ORDER — SODIUM CHLORIDE 0.9 % IV SOLN
200.0000 mg | Freq: Once | INTRAVENOUS | Status: AC
  Administered 2020-08-30: 21:00:00 200 mg via INTRAVENOUS
  Filled 2020-08-30: qty 40

## 2020-08-30 MED ORDER — ASCORBIC ACID 500 MG PO TABS
1000.0000 mg | ORAL_TABLET | Freq: Every day | ORAL | Status: DC
  Administered 2020-08-30 – 2020-09-07 (×9): 1000 mg via ORAL
  Filled 2020-08-30 (×10): qty 2

## 2020-08-30 MED ORDER — METHYLPREDNISOLONE SODIUM SUCC 125 MG IJ SOLR
1.0000 mg/kg | Freq: Two times a day (BID) | INTRAMUSCULAR | Status: DC
  Administered 2020-08-30: 19:00:00 123.125 mg via INTRAVENOUS
  Filled 2020-08-30: qty 2

## 2020-08-30 MED ORDER — ONDANSETRON HCL 4 MG/2ML IJ SOLN: 4.0000 mg | Freq: Four times a day (QID) | INTRAMUSCULAR | Status: DC | PRN

## 2020-08-30 MED ORDER — SIMETHICONE 80 MG PO CHEW
80.0000 mg | CHEWABLE_TABLET | Freq: Four times a day (QID) | ORAL | Status: DC | PRN
  Administered 2020-08-30: 19:00:00 80 mg via ORAL
  Filled 2020-08-30 (×2): qty 1

## 2020-08-30 MED ORDER — ZINC SULFATE 220 (50 ZN) MG PO CAPS
220.0000 mg | ORAL_CAPSULE | Freq: Every day | ORAL | Status: DC
Start: 2020-08-30 — End: 2020-09-07
  Administered 2020-08-30 – 2020-09-07 (×9): 220 mg via ORAL
  Filled 2020-08-30 (×9): qty 1

## 2020-08-30 MED ORDER — HYDROCHLOROTHIAZIDE 25 MG PO TABS
25.0000 mg | ORAL_TABLET | Freq: Every day | ORAL | Status: DC
  Administered 2020-08-30: 19:00:00 25 mg via ORAL
  Filled 2020-08-30: qty 1

## 2020-08-30 MED ORDER — ENOXAPARIN SODIUM 60 MG/0.6ML ~~LOC~~ SOLN
60.0000 mg | SUBCUTANEOUS | Status: DC
  Administered 2020-08-30 – 2020-09-06 (×8): 60 mg via SUBCUTANEOUS
  Filled 2020-08-30 (×8): qty 0.6

## 2020-08-30 MED ORDER — IPRATROPIUM-ALBUTEROL 20-100 MCG/ACT IN AERS
1.0000 | INHALATION_SPRAY | Freq: Four times a day (QID) | RESPIRATORY_TRACT | Status: DC
  Administered 2020-08-30 – 2020-09-06 (×24): 1 via RESPIRATORY_TRACT
  Filled 2020-08-30: qty 4

## 2020-08-30 MED ORDER — PREDNISONE 20 MG PO TABS: 50.0000 mg | ORAL_TABLET | Freq: Every day | ORAL | Status: DC

## 2020-08-30 MED ORDER — VITAMIN D 25 MCG (1000 UNIT) PO TABS
1000.0000 [IU] | ORAL_TABLET | Freq: Every day | ORAL | Status: DC
  Administered 2020-08-30 – 2020-09-07 (×9): 1000 [IU] via ORAL
  Filled 2020-08-30 (×9): qty 1

## 2020-08-30 MED ORDER — ACETAMINOPHEN 325 MG PO TABS: 650.0000 mg | ORAL_TABLET | Freq: Four times a day (QID) | ORAL | Status: DC | PRN

## 2020-08-30 MED ORDER — SODIUM CHLORIDE 0.9 % IV SOLN
100.0000 mg | Freq: Every day | INTRAVENOUS | Status: AC
  Administered 2020-08-31 – 2020-09-03 (×4): 100 mg via INTRAVENOUS
  Filled 2020-08-30 (×4): qty 20

## 2020-08-30 NOTE — Progress Notes (Signed)
RT called to assess patient's WOB. RT placed patient on salter HFNC at [place]. Patient's spo2 at 92% at this time. NRB is hooked up and placed at bedside if needed. RT will continue to monitor as needed.

## 2020-08-30 NOTE — H&P (Signed)
History and Physical    Todd [name]:[id number] DOB: 1966-04-04 DOA: 08/30/2020  PCP: Renford Dills, MD (Confirm with patient/family/NH records and if not entered, this has to be entered at [facility] point of entry) Patient coming from: Home  I have personally briefly reviewed patient's old medical records in [facility]  Chief Complaint: SOB  HPI: Todd [name] is a 54 y.o. male with medical history significant of HTN, morbid obesity status post gastric bypass in 2018, arthritis, presented with new onset of shortness of breath.  Symptoms started 5 days ago, initially was dry cough then started to produce more whitish sputum, spiked fever for 3 days with T-max 101, fever subsided 2 days ago.  No loss of taste or diarrhea but complaining about feeling extremely tired.  EMS arrived and found patient hypoxic with oxygenation 82% on room air.  Patient not vaccinated for COVID for personal reasons. ED Course: Oxygenation stabilized on 4 L, Covid positive, chest x-ray showed bilateral multifocal infiltrates.  Review of Systems: As per HPI otherwise 14 point review of systems negative.    Past Medical History:  Diagnosis Date   Arthritis    Hypertension    Obesity     Past Surgical History:  Procedure Laterality Date   COLONOSCOPY WITH PROPOFOL N/A 08/07/2017   Procedure: COLONOSCOPY WITH PROPOFOL;  Surgeon: Charlott Rakes, MD;  Location: [facility];  Service: Endoscopy;  Laterality: N/A;   FOOT SURGERY     LEG SURGERY       reports that he has never smoked. He has never used smokeless tobacco. He reports current alcohol use. He reports that he does not use drugs.  Allergies  Allergen Reactions   Amoxil [Amoxicillin] Hives and Shortness Of Breath    Has patient had a PCN reaction causing immediate rash, facial/tongue/throat swelling, SOB or lightheadedness with hypotension: Yes Has patient had a PCN reaction causing severe rash involving mucus membranes or skin  necrosis: Yes Has patient had a PCN reaction that required hospitalization: No Has patient had a PCN reaction occurring within the last 10 years: Yes If all of the above answers are "NO", then may proceed with Cephalosporin use.    Tylox [Oxycodone-Acetaminophen] Rash    History reviewed. No pertinent family history.   Prior to Admission medications   Medication Sig Start Date End Date Taking? Authorizing Provider  Ascorbic Acid (VITAMIN C WITH ROSE HIPS) 500 MG tablet Take 1,000 mg by mouth daily.   Yes [provider]  cholecalciferol (VITAMIN D3) 25 MCG (1000 UNIT) tablet Take 1,000 Units by mouth daily.   Yes [provider]  esomeprazole (NEXIUM) 20 MG capsule Take 20 mg by mouth daily as needed (acid reflux).   Yes [provider]  hydrochlorothiazide (HYDRODIURIL) 25 MG tablet Take 25 mg by mouth daily as needed (blood pressure).   Yes [provider]  naproxen sodium (ALEVE) 220 MG tablet Take 440 mg by mouth 2 (two) times daily as needed (pain).   Yes [provider]  OVER THE COUNTER MEDICATION Take 4,000 mg by mouth daily. Black seed oil otc supplement   Yes [provider]  simethicone (MYLICON) 125 MG chewable tablet Chew 125 mg by mouth at bedtime as needed for flatulence.   Yes [provider]  Turmeric 500 MG CAPS Take 500 mg by mouth daily.   Yes [provider]  zinc gluconate 50 MG tablet Take 50 mg by mouth daily.   Yes [provider]  Physical Exam: Vitals:   08/30/20 1545 08/30/20 1600 08/30/20 1645 08/30/20 1700  BP: 136/71 122/60 128/75   Pulse: (!) 51 (!) 54 (!) 52   Resp: 19 (!) 29 (!) 32   Temp:      TempSrc:      SpO2: 95% 98% 96%   Weight:    123.4 kg  Height:    6' (1.829 m)    Constitutional: NAD, calm, comfortable Vitals:   08/30/20 1545 08/30/20 1600 08/30/20 1645 08/30/20 1700  BP: 136/71 122/60 128/75   Pulse: (!) 51 (!) 54 (!) 52   Resp: 19 (!) 29 (!) 32    Temp:      TempSrc:      SpO2: 95% 98% 96%   Weight:    123.4 kg  Height:    6' (1.829 m)   Eyes: PERRL, lids and conjunctivae normal ENMT: Mucous membranes are moist. Posterior pharynx clear of any exudate or lesions.Normal dentition.  Neck: normal, supple, no masses, no thyromegaly Respiratory: clear to auscultation bilaterally, no wheezing, no crackles.  Increasing respiratory effort, talking in broken sentences. No accessory muscle use.  Cardiovascular: Regular rate and rhythm, no murmurs / rubs / gallops. No extremity edema. 2+ pedal pulses. No carotid bruits.  Abdomen: no tenderness, no masses palpated. No hepatosplenomegaly. Bowel sounds positive.  Musculoskeletal: no clubbing / cyanosis. No joint deformity upper and lower extremities. Good ROM, no contractures. Normal muscle tone.  Skin: no rashes, lesions, ulcers. No induration Neurologic: CN 2-12 grossly intact. Sensation intact, DTR normal. Strength 5/5 in all 4.  Psychiatric: Normal judgment and insight. Alert and oriented x 3. Normal mood.     Labs on Admission: I have personally reviewed following labs and imaging studies  CBC: Recent Labs  Lab 08/30/20 1313  WBC 9.1  HGB 14.2  HCT 43.5  MCV 84.8  PLT 96*   Basic Metabolic Panel: Recent Labs  Lab 08/30/20 1313  NA 137  K 3.8  CL 99  CO2 27  GLUCOSE 130*  BUN 15  CREATININE 1.16  CALCIUM 8.7*   GFR: Estimated Creatinine Clearance: 98.7 mL/min (by C-G formula based on SCr of 1.16 mg/dL). Liver Function Tests: No results for input(s): AST, ALT, ALKPHOS, BILITOT, PROT, ALBUMIN in the last 168 hours. No results for input(s): LIPASE, AMYLASE in the last 168 hours. No results for input(s): AMMONIA in the last 168 hours. Coagulation Profile: No results for input(s): INR, PROTIME in the last 168 hours. Cardiac Enzymes: No results for input(s): CKTOTAL, CKMB, CKMBINDEX, TROPONINI in the last 168 hours. BNP (last 3 results) No results for input(s): PROBNP  in the last 8760 hours. HbA1C: No results for input(s): HGBA1C in the last 72 hours. CBG: Recent Labs  Lab 08/30/20 1629  GLUCAP 110*   Lipid Profile: No results for input(s): CHOL, HDL, LDLCALC, TRIG, CHOLHDL, LDLDIRECT in the last 72 hours. Thyroid Function Tests: No results for input(s): TSH, T4TOTAL, FREET4, T3FREE, THYROIDAB in the last 72 hours. Anemia Panel: No results for input(s): VITAMINB12, FOLATE, FERRITIN, TIBC, IRON, RETICCTPCT in the last 72 hours. Urine analysis: No results found for: COLORURINE, APPEARANCEUR, LABSPEC, [place], GLUCOSEU, HGBUR, BILIRUBINUR, KETONESUR, PROTEINUR, UROBILINOGEN, NITRITE, LEUKOCYTESUR  Radiological Exams on Admission: DG Chest Portable 1 View  Result Date: 08/30/2020 CLINICAL DATA:  54 year old male with hypoxia. Positive COVID-19. EXAM: PORTABLE CHEST 1 VIEW COMPARISON:  Chest radiograph dated 11/18/2010. FINDINGS: There is cardiomegaly with vascular congestion. Bilateral mid to lower lung field predominant hazy opacities consistent with  multifocal pneumonia and in keeping with COVID-19. Clinical correlation is recommended. There is no pleural effusion pneumothorax. No acute osseous pathology. IMPRESSION: 1. Multifocal pneumonia. 2. Cardiomegaly with vascular congestion. Electronically Signed   By: Anner Crete M.D.   On: 08/30/2020 16:09    EKG: Independently reviewed.  Chronic RVR on V1, nonspecific ST changes.  Assessment/Plan Active Problems:   COVID-19 virus infection   COVID-19  (please populate well all problems here in Problem List. (For example, if patient is on BP meds at home and you resume or decide to hold them, it is a problem that needs to be her. Same for CAD, COPD, HLD and so on)  Acute hypoxic respite failure secondary to COVID-19 pneumonia -Remdesivir steroid -Discussed with patient about proning/frequent position changes patient agreed  Positive troponins -Trend pattern is flat, likely from demanding  ischemia, patient denied any chest pains.  Patient had a normal nuclear stress test 2 years ago. -Recommend outpatient cardiology follow-up -Repeat 1 more set of troponin tomorrow morning  Elevated glucose level -Check A1c -Start sliding scale coverage  Thrombocytopenia -Might related to acute viral syndrome, no symptoms or signs of bleeding -Trend CBC tomorrow  Morbid obesity -Outpatient OSA work-up -Patient follows with weight loss clinic.  HTN -Resume home BP meds  DVT prophylaxis: Lovenox  code Status: Full code Family Communication: None at bedside Disposition Plan: Patient with multiple comorbidities come with COVID-19 pneumonia and hypoxia, expect more than 2 midnight hold hospital stay Consults called: None Admission status: Telemetry admission   Lequita Halt MD [facility] Pager [phone]  08/30/2020, 6:18 PM

## 2020-08-30 NOTE — ED Notes (Signed)
Pt placed on 4L [place] in triage.

## 2020-08-30 NOTE — ED Provider Notes (Signed)
[place] [facility] EMERGENCY DEPARTMENT Provider Note   CSN: [id number] Arrival date & time: 08/30/20  1255     History Chief Complaint  Patient presents with  . Shortness of Breath    Todd [name] is a 54 y.o. male.  Patient is a 54 year old male with a history of hypertension who presents with cough and shortness of breath.  He said that he started having symptoms on December 22 with a cough.  He has had some mild rhinorrhea.  He does not have any underlying lung disease.  No associated chest pain other than it hurts sometimes when he is coughing.  No leg swelling.  No vomiting or diarrhea.  He was told by his work to come get tested today.  He was found to be hypoxic in triage with an oxygen saturation of 82% on room air.  He was placed on nasal cannula and currently is on 4 L/min.  He reports some mild shortness of breath.        Past Medical History:  Diagnosis Date  . Arthritis   . Hypertension   . Obesity     Patient Active Problem List   Diagnosis Date Noted  . Special screening for malignant neoplasms, colon 08/07/2017    Past Surgical History:  Procedure Laterality Date  . COLONOSCOPY WITH PROPOFOL N/A 08/07/2017   Procedure: COLONOSCOPY WITH PROPOFOL;  Surgeon: Wilford Corner, MD;  Location: [facility];  Service: Endoscopy;  Laterality: N/A;  . FOOT SURGERY    . LEG SURGERY         History reviewed. No pertinent family history.  Social History   Tobacco Use  . Smoking status: Never Smoker  . Smokeless tobacco: Never Used  Substance Use Topics  . Alcohol use: Yes    Comment: occasionally   . Drug use: No    Home Medications Prior to Admission medications   Medication Sig Start Date End Date Taking? Authorizing Provider  Ascorbic Acid (VITAMIN C WITH ROSE HIPS) 500 MG tablet Take 1,000 mg by mouth daily.   Yes [provider]  cholecalciferol (VITAMIN D3) 25 MCG (1000 UNIT) tablet Take 1,000 Units by mouth daily.   Yes  [provider]  esomeprazole (NEXIUM) 20 MG capsule Take 20 mg by mouth daily as needed (acid reflux).   Yes [provider]  hydrochlorothiazide (HYDRODIURIL) 25 MG tablet Take 25 mg by mouth daily as needed (blood pressure).   Yes [provider]  naproxen sodium (ALEVE) 220 MG tablet Take 440 mg by mouth 2 (two) times daily as needed (pain).   Yes [provider]  OVER THE COUNTER MEDICATION Take 4,000 mg by mouth daily. Black seed oil otc supplement   Yes [provider]  simethicone (MYLICON) 0000000 MG chewable tablet Chew 125 mg by mouth at bedtime as needed for flatulence.   Yes [provider]  Turmeric 500 MG CAPS Take 500 mg by mouth daily.   Yes [provider]  zinc gluconate 50 MG tablet Take 50 mg by mouth daily.   Yes [provider]    Allergies    Amoxil [amoxicillin] and Tylox [oxycodone-acetaminophen]  Review of Systems   Review of Systems  Constitutional: Positive for fatigue. Negative for chills, diaphoresis and fever.  HENT: Positive for congestion and rhinorrhea. Negative for sneezing.   Eyes: Negative.   Respiratory: Positive for cough and shortness of breath. Negative for chest tightness.   Cardiovascular: Negative for chest pain and leg  swelling.  Gastrointestinal: Negative for abdominal pain, blood in stool, diarrhea, nausea and vomiting.  Genitourinary: Negative for difficulty urinating, flank pain, frequency and hematuria.  Musculoskeletal: Negative for arthralgias and back pain.  Skin: Negative for rash.  Neurological: Negative for dizziness, speech difficulty, weakness, numbness and headaches.    Physical Exam Updated Vital Signs BP 128/75   Pulse (!) 52   Temp 98.6 F (37 C) (Oral)   Resp (!) 32   SpO2 96%   Physical Exam Constitutional:      Appearance: He is well-developed and well-nourished.  HENT:     Head: Normocephalic and atraumatic.  Eyes:     Pupils: Pupils are  equal, round, and reactive to light.  Cardiovascular:     Rate and Rhythm: Normal rate and regular rhythm.     Heart sounds: Normal heart sounds.  Pulmonary:     Effort: Pulmonary effort is normal. No respiratory distress.     Breath sounds: Normal breath sounds. No wheezing or rales.  Chest:     Chest wall: No tenderness.  Abdominal:     General: Bowel sounds are normal.     Palpations: Abdomen is soft.     Tenderness: There is no abdominal tenderness. There is no guarding or rebound.  Musculoskeletal:        General: No edema. Normal range of motion.     Cervical back: Normal range of motion and neck supple.  Lymphadenopathy:     Cervical: No cervical adenopathy.  Skin:    General: Skin is warm and dry.     Findings: No rash.  Neurological:     Mental Status: He is alert and oriented to person, place, and time.  Psychiatric:        Mood and Affect: Mood and affect normal.     ED Results / Procedures / Treatments   Labs (all labs ordered are listed, but only abnormal results are displayed) Labs Reviewed  RESP PANEL BY RT-PCR (FLU A&B, COVID) ARPGX2 - Abnormal; Notable for the following components:      Result Value   SARS Coronavirus 2 by RT PCR POSITIVE (*)    All other components within normal limits  BASIC METABOLIC PANEL - Abnormal; Notable for the following components:   Glucose, Bld 130 (*)    Calcium 8.7 (*)    All other components within normal limits  CBC - Abnormal; Notable for the following components:   Platelets 96 (*)    All other components within normal limits  CBG MONITORING, ED - Abnormal; Notable for the following components:   Glucose-Capillary 110 (*)    All other components within normal limits  TROPONIN I (HIGH SENSITIVITY) - Abnormal; Notable for the following components:   Troponin I (High Sensitivity) 60 (*)    All other components within normal limits  CULTURE, BLOOD (ROUTINE X 2)  CULTURE, BLOOD (ROUTINE X 2)  LACTIC ACID, PLASMA   LACTIC ACID, PLASMA  D-DIMER, QUANTITATIVE (NOT AT [facility])  PROCALCITONIN  LACTATE DEHYDROGENASE  FERRITIN  TRIGLYCERIDES  FIBRINOGEN  C-REACTIVE PROTEIN  HEPATIC FUNCTION PANEL  TROPONIN I (HIGH SENSITIVITY)    EKG EKG Interpretation  Date/Time:  Monday August 30 2020 13:04:28 EST Ventricular Rate:  70 PR Interval:  168 QRS Duration: 90 QT Interval:  410 QTC Calculation: 442 R Axis:   -50 Text Interpretation: Normal sinus rhythm Left anterior fascicular block Abnormal ECG since last tracing no significant change Confirmed by Malvin Johns [id number]) on 08/30/2020 4:26:33 PM  Radiology DG Chest Portable 1 View  Result Date: 08/30/2020 CLINICAL DATA:  54 year old male with hypoxia. Positive COVID-19. EXAM: PORTABLE CHEST 1 VIEW COMPARISON:  Chest radiograph dated 11/18/2010. FINDINGS: There is cardiomegaly with vascular congestion. Bilateral mid to lower lung field predominant hazy opacities consistent with multifocal pneumonia and in keeping with COVID-19. Clinical correlation is recommended. There is no pleural effusion pneumothorax. No acute osseous pathology. IMPRESSION: 1. Multifocal pneumonia. 2. Cardiomegaly with vascular congestion. Electronically Signed   By: Anner Crete M.D.   On: 08/30/2020 16:09    Procedures Procedures (including critical care time)  Medications Ordered in ED Medications - No data to display  ED Course  I have reviewed the triage vital signs and the nursing notes.  Pertinent labs & imaging results that were available during my care of the patient were reviewed by me and considered in my medical decision making (see chart for details).    MDM Rules/Calculators/A&P                          Patient is a 54 year old male who presents with cough and shortness of breath.  He was found to be hypoxic at 82% on room air and was placed on nasal cannula at 4 L/min.  His oxygen saturations are 91-92 on 4 L.  He does not have any increased work of  breathing.  His chest x-ray shows multifocal pneumonia consistent with his Covid infection.  His Covid test is positive.  His troponin is mildly elevated but he denies any chest pain other than with coughing and he has no ischemic changes on EKG.  His platelet count is mildly low.  I spoke with the hospitalist, Dr. Roosevelt Locks who admit the patient for further treatment. Final Clinical Impression(s) / ED Diagnoses Final diagnoses:  Pneumonia due to COVID-19 virus  Hypoxia    Rx / DC Orders ED Discharge Orders    None       Malvin Johns, MD 08/30/20 1711

## 2020-08-30 NOTE — ED Notes (Addendum)
Respiratory paged, pt placed on NR

## 2020-08-30 NOTE — ED Triage Notes (Signed)
Pt sp02 in triage was 82% on RA.Pt reports he started having a cough 22nd. Pt states he has not been vaccinated.

## 2020-08-30 NOTE — ED Notes (Signed)
Attempted to obtain 2nd set of cultures unsuccessfully x2

## 2020-08-31 ENCOUNTER — Inpatient Hospital Stay (HOSPITAL_COMMUNITY): Payer: BC Managed Care – PPO

## 2020-08-31 DIAGNOSIS — I5031 Acute diastolic (congestive) heart failure: Secondary | ICD-10-CM

## 2020-08-31 LAB — COMPREHENSIVE METABOLIC PANEL
ALT: 27 U/L (ref 0–44)
AST: 46 U/L — ABNORMAL HIGH (ref 15–41)
Albumin: 2.5 g/dL — ABNORMAL LOW (ref 3.5–5.0)
Alkaline Phosphatase: 50 U/L (ref 38–126)
Anion gap: 11 (ref 5–15)
BUN: 15 mg/dL (ref 6–20)
CO2: 25 mmol/L (ref 22–32)
Calcium: 8.2 mg/dL — ABNORMAL LOW (ref 8.9–10.3)
Chloride: 100 mmol/L (ref 98–111)
Creatinine, Ser: 0.94 mg/dL (ref 0.61–1.24)
GFR, Estimated: >60 mL/min (ref >60)
Glucose, Bld: 116 mg/dL — ABNORMAL HIGH (ref 70–99)
Potassium: 3.6 mmol/L (ref 3.5–5.1)
Sodium: 136 mmol/L (ref 135–145)
Total Bilirubin: 1.1 mg/dL (ref 0.3–1.2)
Total Protein: 5.9 g/dL — ABNORMAL LOW (ref 6.5–8.1)

## 2020-08-31 LAB — GLUCOSE, CAPILLARY: Glucose-Capillary: 108 mg/dL — ABNORMAL HIGH (ref 70–99)

## 2020-08-31 LAB — CBC WITH DIFFERENTIAL/PLATELET
Abs Immature Granulocytes: 0.11 10*3/uL — ABNORMAL HIGH (ref 0.00–0.07)
Basophils Absolute: 0 10*3/uL (ref 0.0–0.1)
Basophils Relative: 0 %
Eosinophils Absolute: 0 10*3/uL (ref 0.0–0.5)
Eosinophils Relative: 0 %
HCT: 40.7 % (ref 39.0–52.0)
Hemoglobin: 13.5 g/dL (ref 13.0–17.0)
Immature Granulocytes: 1 %
Lymphocytes Relative: 10 %
Lymphs Abs: 1 10*3/uL (ref 0.7–4.0)
MCH: 28.1 pg (ref 26.0–34.0)
MCHC: 33.2 g/dL (ref 30.0–36.0)
MCV: 84.8 fL (ref 80.0–100.0)
Monocytes Absolute: 0.4 10*3/uL (ref 0.1–1.0)
Monocytes Relative: 4 %
Neutro Abs: 8 10*3/uL — ABNORMAL HIGH (ref 1.7–7.7)
Neutrophils Relative %: 85 %
Platelets: 88 10*3/uL — ABNORMAL LOW (ref 150–400)
RBC: 4.8 MIL/uL (ref 4.22–5.81)
RDW: 13.7 % (ref 11.5–15.5)
WBC: 9.5 10*3/uL (ref 4.0–10.5)
nRBC: 0 % (ref 0.0–0.2)

## 2020-08-31 LAB — CBG MONITORING, ED
Glucose-Capillary: 107 mg/dL — ABNORMAL HIGH (ref 70–99)
Glucose-Capillary: 114 mg/dL — ABNORMAL HIGH (ref 70–99)
Glucose-Capillary: 123 mg/dL — ABNORMAL HIGH (ref 70–99)
Glucose-Capillary: 128 mg/dL — ABNORMAL HIGH (ref 70–99)

## 2020-08-31 LAB — ECHOCARDIOGRAM LIMITED
Area-P 1/2: 2.99 cm2
Calc EF: 54.9 %
Height: 72 in
S' Lateral: 3.7 cm
Single Plane A2C EF: 58.7 %
Single Plane A4C EF: 52.2 %
Weight: 4353.6 oz

## 2020-08-31 LAB — HEMOGLOBIN A1C
Hgb A1c MFr Bld: 5.1 % (ref 4.8–5.6)
Mean Plasma Glucose: 99.67 mg/dL

## 2020-08-31 LAB — D-DIMER, QUANTITATIVE: D-Dimer, Quant: 1.21 ug/mL-FEU — ABNORMAL HIGH (ref 0.00–0.50)

## 2020-08-31 LAB — MAGNESIUM: Magnesium: 2.1 mg/dL (ref 1.7–2.4)

## 2020-08-31 LAB — FERRITIN: Ferritin: 738 ng/mL — ABNORMAL HIGH (ref 24–336)

## 2020-08-31 LAB — HIV ANTIBODY (ROUTINE TESTING W REFLEX): HIV Screen 4th Generation wRfx: NONREACTIVE

## 2020-08-31 LAB — TROPONIN I (HIGH SENSITIVITY): Troponin I (High Sensitivity): 41 ng/L — ABNORMAL HIGH (ref <18)

## 2020-08-31 LAB — BRAIN NATRIURETIC PEPTIDE: B Natriuretic Peptide: 117.7 pg/mL — ABNORMAL HIGH (ref 0.0–100.0)

## 2020-08-31 LAB — PHOSPHORUS: Phosphorus: 3.7 mg/dL (ref 2.5–4.6)

## 2020-08-31 LAB — PROCALCITONIN: Procalcitonin: 0.71 ng/mL

## 2020-08-31 LAB — C-REACTIVE PROTEIN: CRP: 19.3 mg/dL — ABNORMAL HIGH (ref <1.0)

## 2020-08-31 MED ORDER — TOCILIZUMAB 400 MG/20ML IV SOLN
800.0000 mg | Freq: Once | INTRAVENOUS | Status: AC
  Administered 2020-08-31: 08:00:00 800 mg via INTRAVENOUS
  Filled 2020-08-31: qty 40

## 2020-08-31 MED ORDER — INSULIN ASPART 100 UNIT/ML ~~LOC~~ SOLN: 0.0000 [IU] | Freq: Every day | SUBCUTANEOUS | Status: DC

## 2020-08-31 MED ORDER — ASPIRIN 81 MG PO CHEW
81.0000 mg | CHEWABLE_TABLET | Freq: Every day | ORAL | Status: DC
  Administered 2020-08-31 – 2020-09-07 (×8): 81 mg via ORAL
  Filled 2020-08-31 (×8): qty 1

## 2020-08-31 MED ORDER — HYDRALAZINE HCL 20 MG/ML IJ SOLN: 10.0000 mg | Freq: Four times a day (QID) | INTRAMUSCULAR | Status: DC | PRN

## 2020-08-31 MED ORDER — LACTATED RINGERS IV SOLN: INTRAVENOUS | Status: AC

## 2020-08-31 MED ORDER — METHYLPREDNISOLONE SODIUM SUCC 125 MG IJ SOLR
60.0000 mg | Freq: Two times a day (BID) | INTRAMUSCULAR | Status: DC
  Administered 2020-08-31 – 2020-09-07 (×15): 60 mg via INTRAVENOUS
  Filled 2020-08-31 (×15): qty 2

## 2020-08-31 NOTE — ED Notes (Signed)
Pt oxygen dropped to mid 70's on high flow 15L, resp called, non rebreather added at 15L

## 2020-08-31 NOTE — ED Notes (Signed)
Todd [name], brother, [phone] would like an update when available

## 2020-08-31 NOTE — ED Notes (Signed)
PAGED A. Zierle-Ghosh TO RN Meriam Sprague

## 2020-08-31 NOTE — ED Notes (Signed)
Pt placed on heated HiFlow with NRB over. RT at bedside. Pt O2 sat 85% during transition to heated HiFlow with slow recovery.  Pt placed on left side in modified fetal position with quick recovery to 97%.  Pt educated about using modified fetal position instead of prone; pt voices understanding and says he is comfortable in this position.

## 2020-08-31 NOTE — ED Notes (Signed)
Pt moved to chair. Sitting upright. VSS. Will continue to monitor pt.

## 2020-08-31 NOTE — Progress Notes (Addendum)
PROGRESS NOTE                                                                                                                                                                                                             Patient Demographics:    Todd [name], is a 54 y.o. male, DOB - June 30, 1966, [id number]  Outpatient Primary MD for the patient is Seward Carol, MD    LOS - 1  Admit date - 08/30/2020    Chief Complaint  Patient presents with   Shortness of Breath       Brief Narrative (HPI from H&P) Todd [name] is a 54 y.o. male with medical history significant of HTN, morbid obesity status post gastric bypass in 2018, arthritis, presented with new onset of shortness of breath.  Symptoms started 5 days before hospital visit, he was diagnosed with acute hypoxic respiratory failure due to COVID-19 pneumonia and admitted to the hospital   Subjective:    Todd [name] today has, No headache, No chest pain, No abdominal pain - No Nausea, No new weakness tingling or numbness, ulcerative cough and shortness of breath.   Assessment  & Plan :    Addendum - reeval in ER 12.30 pm - 5 hrs after Actemra, much better, now on 20 lits [place] (from 35 lits) and feels better.   1. Acute Hypoxic Resp. Failure due to Acute Covid 19 Viral Pneumonitis during the ongoing 2020 Covid 19 Pandemic - he is unfortunately not vaccinated and has incurred severe parenchymal lung injury due to COVID-19 pneumonia causing ARDS.  He has been placed on high-dose IV steroids, Remdesivir along with Actemra after proper consent.  Monitor him closely very tenuous.  Encouraged the patient to sit up in chair in the daytime use I-S and flutter valve for pulmonary toiletry and then prone in bed when at night.  Will advance activity and titrate down oxygen as possible.  Actemra/Baricitinib  off label use - patient was told that if COVID-19 pneumonitis gets worse  we might potentially use Actemra off label, patient denies any known history of active diverticulitis, tuberculosis or hepatitis, understands the risks and benefits and wants to proceed with Actemra treatment if required.     SpO2: 91 % O2 Flow Rate (L/min): 35 L/min FiO2 (%): 100 %  Recent Labs  Lab 08/30/20 1312  08/30/20 1313 08/30/20 1820 08/30/20 1821 08/31/20 0409  WBC  --  9.1  --   --  9.5  HGB  --  14.2  --   --  13.5  HCT  --  43.5  --   --  40.7  PLT  --  96*  --   --  88*  CRP  --   --  21.0*  --  19.3*  DDIMER  --   --  1.17*  --  1.21*  PROCALCITON  --   --  0.44  --   --   AST  --   --  48*  --  46*  ALT  --   --  29  --  27  ALKPHOS  --   --  55  --  50  BILITOT  --   --  1.4*  --  1.1  ALBUMIN  --   --  3.0*  --  2.5*  LATICACIDVEN  --   --   --  1.2  --   SARSCOV2NAA POSITIVE*  --   --   --   --       2.  Severe dehydration.  Placed on IV fluids  3. HX of gastric bypass.  Supportive care.  4.  GERD.  PPI.  5. Hypertension.  Blood pressure soft monitor with hydration.  As needed hydralazine  6.  Hypoxia induced mild bump in troponin, chest pain-free, EKG nonacute.  Is on low-dose aspirin and check echo.   Condition - Extremely Guarded  Family Communication  :  Wife Marcelle Smiling [phone] 08/31/20  Code Status :  Full  Consults  :  None  Procedures  :    PUD Prophylaxis : PPI  Disposition Plan  :    Status is: Inpatient  Remains inpatient appropriate because:IV treatments appropriate due to intensity of illness or inability to take PO   Dispo: The patient is from: Home              Anticipated d/c is to: Home              Anticipated d/c date is: > 3 days              Patient currently is not medically stable to d/c.   DVT Prophylaxis  :  Lovenox    Lab Results  Component Value Date   PLT 88 (L) 08/31/2020    Diet :  Diet Order            Diet 2 gram sodium Room service appropriate? Yes; Fluid consistency: Thin  Diet effective  now                  Inpatient Medications  Scheduled Meds:  vitamin C with rose hips  1,000 mg Oral Daily   cholecalciferol  1,000 Units Oral Daily   enoxaparin (LOVENOX) injection  60 mg Subcutaneous Q24H   insulin aspart  0-15 Units Subcutaneous TID WC   insulin aspart  0-5 Units Subcutaneous QHS   Ipratropium-Albuterol  1 puff Inhalation Q6H   methylPREDNISolone (SOLU-MEDROL) injection  60 mg Intravenous Q12H   pantoprazole  40 mg Oral Daily   zinc sulfate  220 mg Oral Daily   Continuous Infusions:  lactated ringers 125 mL/hr at 08/31/20 0757   remdesivir 100 mg in NS 100 mL     tocilizumab (ACTEMRA) - non-COVID treatment 800 mg (08/31/20 0817)   PRN Meds:.acetaminophen, guaiFENesin-dextromethorphan, [DISCONTINUED] ondansetron **OR** ondansetron (  ZOFRAN) IV, simethicone  Antibiotics  :    Anti-infectives (From admission, onward)   Start     Dose/Rate Route Frequency Ordered Stop   08/31/20 1000  remdesivir 100 mg in sodium chloride 0.9 % 100 mL IVPB       "Followed by" Linked Group Details   100 mg 200 mL/hr over 30 Minutes Intravenous Daily 08/30/20 1742 09/04/20 0959   08/30/20 1745  remdesivir 200 mg in sodium chloride 0.9% 250 mL IVPB       "Followed by" Linked Group Details   200 mg 580 mL/hr over 30 Minutes Intravenous Once 08/30/20 1742 08/30/20 2121       Time Spent in minutes  30   Lala Lund M.D on 08/31/2020 at 8:32 AM  To page go to [email or web address]   [facility] -  Office  [phone]    See all Orders from today for further details    Objective:   Vitals:   08/31/20 0802 08/31/20 0819 08/31/20 0821 08/31/20 0822  BP:   115/64   Pulse:   72 75  Resp: (!) 32  (!) 42 19  Temp:      TempSrc:      SpO2: (!) 88% 90% 91% 91%  Weight:      Height:        Wt Readings from Last 3 Encounters:  08/30/20 123.4 kg  08/07/17 (!) 217.7 kg  04/03/16 (!) 217.7 kg    No intake or output data in the 24 hours ending 08/31/20  [id number]   Physical Exam  Awake Alert, No new F.N deficits, Normal affect [place].AT,PERRAL Supple Neck,No JVD, No cervical lymphadenopathy appriciated.  Symmetrical Chest wall movement, Good air movement bilaterally, CTAB RRR,No Gallops,Rubs or new Murmurs, No Parasternal Heave +ve B.Sounds, Abd Soft, No tenderness, No organomegaly appriciated, No rebound - guarding or rigidity. No Cyanosis, Clubbing or edema, No new Rash or bruise       Data Review:    CBC Recent Labs  Lab 08/30/20 1313 08/31/20 0409  WBC 9.1 9.5  HGB 14.2 13.5  HCT 43.5 40.7  PLT 96* 88*  MCV 84.8 84.8  MCH 27.7 28.1  MCHC 32.6 33.2  RDW 13.8 13.7  LYMPHSABS  --  1.0  MONOABS  --  0.4  EOSABS  --  0.0  BASOSABS  --  0.0    Recent Labs  Lab 08/30/20 1313 08/30/20 1820 08/30/20 1821 08/31/20 0409  NA 137  --   --  136  K 3.8  --   --  3.6  CL 99  --   --  100  CO2 27  --   --  25  GLUCOSE 130*  --   --  116*  BUN 15  --   --  15  CREATININE 1.16  --   --  0.94  CALCIUM 8.7*  --   --  8.2*  AST  --  48*  --  46*  ALT  --  29  --  27  ALKPHOS  --  55  --  50  BILITOT  --  1.4*  --  1.1  ALBUMIN  --  3.0*  --  2.5*  MG  --   --   --  2.1  CRP  --  21.0*  --  19.3*  DDIMER  --  1.17*  --  1.21*  PROCALCITON  --  0.44  --   --   LATICACIDVEN  --   --  1.2  --     ------------------------------------------------------------------------------------------------------------------ Recent Labs    08/30/20 1800  TRIG 64    No results found for: HGBA1C ------------------------------------------------------------------------------------------------------------------ No results for input(s): TSH, T4TOTAL, T3FREE, THYROIDAB in the last 72 hours.  Invalid input(s): FREET3  Cardiac Enzymes No results for input(s): CKMB, TROPONINI, MYOGLOBIN in the last 168 hours.  Invalid input(s):  CK ------------------------------------------------------------------------------------------------------------------ No results found for: BNP  Micro Results Recent Results (from the past 240 hour(s))  Resp Panel by RT-PCR (Flu A&B, Covid) Nasopharyngeal Swab     Status: Abnormal   Collection Time: 08/30/20  1:12 PM   Specimen: Nasopharyngeal Swab; Nasopharyngeal(NP) swabs in vial transport medium  Result Value Ref Range Status   SARS Coronavirus 2 by RT PCR POSITIVE (A) NEGATIVE Final    Comment: emailed L. Berdik RN 14:20 08/30/20 ([email or web address]) (NOTE) SARS-CoV-2 target nucleic acids are DETECTED.  The SARS-CoV-2 RNA is generally detectable in upper respiratory specimens during the acute phase of infection. Positive results are indicative of the presence of the identified virus, but do not rule out bacterial infection or co-infection with other pathogens not detected by the test. Clinical correlation with patient history and other diagnostic information is necessary to determine patient infection status. The expected result is Negative.  Fact Sheet for Patients: EntrepreneurPulse.com.au  Fact Sheet for Healthcare Providers: IncredibleEmployment.be  This test is not yet approved or cleared by the [place] FDA and  has been authorized for detection and/or diagnosis of SARS-CoV-2 by FDA under an Emergency Use Authorization (EUA).  This EUA will remain in effect (meaning this test can be used) for the duration of  the COVID- 19 declaration under Section 564(b)(1) of the Act, 21 U.S.C. section 360bbb-3(b)(1), unless the authorization is terminated or revoked sooner.     Influenza A by PCR NEGATIVE NEGATIVE Final   Influenza B by PCR NEGATIVE NEGATIVE Final    Comment: (NOTE) The Xpert Xpress SARS-CoV-2/FLU/RSV plus assay is intended as an aid in the diagnosis of influenza from Nasopharyngeal swab specimens and should not be used as a sole  basis for treatment. Nasal washings and aspirates are unacceptable for Xpert Xpress SARS-CoV-2/FLU/RSV testing.  Fact Sheet for Patients: EntrepreneurPulse.com.au  Fact Sheet for Healthcare Providers: IncredibleEmployment.be  This test is not yet approved or cleared by the [place] FDA and has been authorized for detection and/or diagnosis of SARS-CoV-2 by FDA under an Emergency Use Authorization (EUA). This EUA will remain in effect (meaning this test can be used) for the duration of the COVID-19 declaration under Section 564(b)(1) of the Act, 21 U.S.C. section 360bbb-3(b)(1), unless the authorization is terminated or revoked.  Performed at [facility], [place] [address], [place], [place] [address]     Radiology Reports DG Chest Portable 1 View  Result Date: 08/30/2020 CLINICAL DATA:  54 year old male with hypoxia. Positive COVID-19. EXAM: PORTABLE CHEST 1 VIEW COMPARISON:  Chest radiograph dated 11/18/2010. FINDINGS: There is cardiomegaly with vascular congestion. Bilateral mid to lower lung field predominant hazy opacities consistent with multifocal pneumonia and in keeping with COVID-19. Clinical correlation is recommended. There is no pleural effusion pneumothorax. No acute osseous pathology. IMPRESSION: 1. Multifocal pneumonia. 2. Cardiomegaly with vascular congestion. Electronically Signed   By: Anner Crete M.D.   On: 08/30/2020 16:09

## 2020-08-31 NOTE — ED Notes (Signed)
Pt moved to Room 24; report given to [facility], [place]; pt with O2 sat of 97 on NRB for transport; pt replaced on heated HiFlow and NRB discontinued; O2 sat remains at 95%; pt denies needs at this time.

## 2020-08-31 NOTE — ED Notes (Signed)
Tele  Breakfast Ordered 

## 2020-08-31 NOTE — ED Notes (Signed)
Patient's O2 sats dropped into the 80s, upon assessment, patient had proned himself, however, sats had previously maintained in high 90s while patient lying on his R side. Pt instructed to reposition back to R side. O2 Sats improved back to mid 90s.

## 2020-08-31 NOTE — ED Notes (Signed)
BS 114

## 2020-08-31 NOTE — Progress Notes (Signed)
  Echocardiogram 2D Echocardiogram has been performed.  Stark Bray Swaim 08/31/2020, 3:06 PM

## 2020-08-31 NOTE — ED Notes (Signed)
Lunch Tray Ordered @ 1029. 

## 2020-08-31 NOTE — ED Notes (Signed)
Pt turning from left side to right side, assuming modified fetal position.  Pt continues on heated HiFlow and NRB with O2 sat of 100%. Pt states he is comfortable in this position.

## 2020-08-31 NOTE — ED Notes (Signed)
Please call wife with updates- [name] [phone]

## 2020-08-31 NOTE — ED Notes (Signed)
Public affairs consultant Ordered @ [phone]

## 2020-08-31 NOTE — Progress Notes (Signed)
Patient's spo2 decreased to low 70s and RR increased to 45. RT placed patient on heated HFNC at 25L and 100% fio2. Patient lying on left side and spo2 increased to 100%. RT will continue to monitor.

## 2020-08-31 NOTE — Progress Notes (Signed)
Patient placed on 15L high flow salter at this time due to patient only being on 15L on heated high flow. Patient tolerating well at this time. RN made aware.

## 2020-09-01 ENCOUNTER — Other Ambulatory Visit: Payer: Self-pay

## 2020-09-01 LAB — GLUCOSE, CAPILLARY
Glucose-Capillary: 130 mg/dL — ABNORMAL HIGH (ref 70–99)
Glucose-Capillary: 138 mg/dL — ABNORMAL HIGH (ref 70–99)
Glucose-Capillary: 120 mg/dL — ABNORMAL HIGH (ref 70–99)
Glucose-Capillary: 120 mg/dL — ABNORMAL HIGH (ref 70–99)

## 2020-09-01 LAB — CBC WITH DIFFERENTIAL/PLATELET
Abs Immature Granulocytes: 0 10*3/uL (ref 0.00–0.07)
Basophils Absolute: 0 10*3/uL (ref 0.0–0.1)
Basophils Relative: 0 %
Eosinophils Absolute: 0 10*3/uL (ref 0.0–0.5)
Eosinophils Relative: 0 %
HCT: 43.1 % (ref 39.0–52.0)
Hemoglobin: 14 g/dL (ref 13.0–17.0)
Lymphocytes Relative: 6 %
Lymphs Abs: 0.3 10*3/uL — ABNORMAL LOW (ref 0.7–4.0)
MCH: 27.1 pg (ref 26.0–34.0)
MCHC: 32.5 g/dL (ref 30.0–36.0)
MCV: 83.5 fL (ref 80.0–100.0)
Monocytes Absolute: 0.2 10*3/uL (ref 0.1–1.0)
Monocytes Relative: 4 %
Neutro Abs: 4.6 10*3/uL (ref 1.7–7.7)
Neutrophils Relative %: 90 %
Platelets: 128 10*3/uL — ABNORMAL LOW (ref 150–400)
RBC: 5.16 MIL/uL (ref 4.22–5.81)
RDW: 13.4 % (ref 11.5–15.5)
WBC: 5.1 10*3/uL (ref 4.0–10.5)
nRBC: 0 % (ref 0.0–0.2)
nRBC: 0 /100 WBC

## 2020-09-01 LAB — C-REACTIVE PROTEIN: CRP: 19.2 mg/dL — ABNORMAL HIGH (ref <1.0)

## 2020-09-01 LAB — COMPREHENSIVE METABOLIC PANEL
ALT: 29 U/L (ref 0–44)
AST: 42 U/L — ABNORMAL HIGH (ref 15–41)
Albumin: 2.4 g/dL — ABNORMAL LOW (ref 3.5–5.0)
Alkaline Phosphatase: 52 U/L (ref 38–126)
Anion gap: 9 (ref 5–15)
BUN: 20 mg/dL (ref 6–20)
CO2: 28 mmol/L (ref 22–32)
Calcium: 8.5 mg/dL — ABNORMAL LOW (ref 8.9–10.3)
Chloride: 100 mmol/L (ref 98–111)
Creatinine, Ser: 0.86 mg/dL (ref 0.61–1.24)
GFR, Estimated: >60 mL/min (ref >60)
Glucose, Bld: 137 mg/dL — ABNORMAL HIGH (ref 70–99)
Potassium: 3.6 mmol/L (ref 3.5–5.1)
Sodium: 137 mmol/L (ref 135–145)
Total Bilirubin: 1.1 mg/dL (ref 0.3–1.2)
Total Protein: 6.2 g/dL — ABNORMAL LOW (ref 6.5–8.1)

## 2020-09-01 LAB — PROCALCITONIN: Procalcitonin: 0.51 ng/mL

## 2020-09-01 LAB — D-DIMER, QUANTITATIVE: D-Dimer, Quant: 1.71 ug/mL-FEU — ABNORMAL HIGH (ref 0.00–0.50)

## 2020-09-01 LAB — MAGNESIUM: Magnesium: 2.2 mg/dL (ref 1.7–2.4)

## 2020-09-01 LAB — BRAIN NATRIURETIC PEPTIDE: B Natriuretic Peptide: 80 pg/mL (ref 0.0–100.0)

## 2020-09-01 NOTE — Plan of Care (Signed)
  Problem: Respiratory: Goal: Will maintain a patent airway Outcome: Progressing   Problem: Safety: Goal: Ability to remain free from injury will improve Outcome: Progressing   Problem: Skin Integrity: Goal: Risk for impaired skin integrity will decrease Outcome: Progressing

## 2020-09-01 NOTE — Progress Notes (Signed)
PROGRESS NOTE                                                                                                                                                                                                             Patient Demographics:    Todd [name], is a 54 y.o. male, DOB - 1965-10-13, [id number]  Outpatient Primary MD for the patient is Seward Carol, MD    LOS - 2  Admit date - 08/30/2020    Chief Complaint  Patient presents with  . Shortness of Breath       Brief Narrative (HPI from H&P) Todd [name] is a 54 y.o. male with medical history significant of HTN, morbid obesity status post gastric bypass in 2018, arthritis, presented with new onset of shortness of breath.  Symptoms started 5 days before hospital visit, he was diagnosed with acute hypoxic respiratory failure due to COVID-19 pneumonia and admitted to the hospital   Subjective:   Patient seen and examined.  He states that his breathing is getting slowly better.  He did have some epistaxis earlier today but it has stopped now.   Assessment  & Plan :    Addendum - reeval in ER 12.30 pm - 5 hrs after Actemra, much better, now on 20 lits K[place] (from 35 lits) and feels better.   1. Acute Hypoxic Resp. Failure due to Acute Covid 19 Viral Pneumonitis during the ongoing 2020 Covid 19 Pandemic - he is unfortunately not vaccinated and has incurred severe parenchymal lung injury due to COVID-19 pneumonia causing ARDS.  He has been placed on high-dose IV steroids, Remdesivir along with Actemra after proper consent.  Monitor him closely very tenuous.  Encouraged the patient to sit up in chair in the daytime use I-S and flutter valve for pulmonary toiletry and then prone in bed when at night.  Will advance activity and titrate down oxygen as possible.  Actemra/Baricitinib  off label use - patient was told that if COVID-19 pneumonitis gets worse we might  potentially use Actemra off label, patient denies any known history of active diverticulitis, tuberculosis or hepatitis, understands the risks and benefits and wants to proceed with Actemra treatment if required.     SpO2: 90 % O2 Flow Rate (L/min): 15 L/min FiO2 (%): 100 %  Recent Labs  Lab 08/30/20 1312 08/30/20  1313 08/30/20 1820 08/30/20 1821 08/31/20 0409 08/31/20 0815 09/01/20 0103  WBC  --  9.1  --   --  9.5  --  5.1  HGB  --  14.2  --   --  13.5  --  14.0  HCT  --  43.5  --   --  40.7  --  43.1  PLT  --  96*  --   --  88*  --  128*  CRP  --   --  21.0*  --  19.3*  --  19.2*  BNP  --   --   --   --   --  117.7* 80.0  DDIMER  --   --  1.17*  --  1.21*  --  1.71*  PROCALCITON  --   --  0.44  --   --  0.71 0.51  AST  --   --  48*  --  46*  --  42*  ALT  --   --  29  --  27  --  29  ALKPHOS  --   --  55  --  50  --  52  BILITOT  --   --  1.4*  --  1.1  --  1.1  ALBUMIN  --   --  3.0*  --  2.5*  --  2.4*  LATICACIDVEN  --   --   --  1.2  --   --   --   SARSCOV2NAA POSITIVE*  --   --   --   --   --   --       2.  Severe dehydration.  Resolved.  3. HX of gastric bypass.  Supportive care.  4.  GERD.  PPI.  5. Hypertension.  Blood pressure well controlled.  Continue as needed hydralazine.  6.  Hypoxia induced mild bump in troponin, chest pain-free, EKG nonacute.  Is on low-dose aspirin and echo shows normal ejection fraction, no WMA.  7.  Epistaxis: Likely due to high flow oxygen.  Stopped.  Monitor closely.  Condition - Extremely Guarded  Family Communication  :  Wife Ronny Bacon [phone] 08/31/20  Code Status :  Full  Consults  :  None  Procedures  :    PUD Prophylaxis : PPI  Disposition Plan  : To be determined.  May need several more days in the hospital.  Status is: Inpatient  Remains inpatient appropriate because:IV treatments appropriate due to intensity of illness or inability to take PO   Dispo: The patient is from: Home               Anticipated d/c is to: Home              Anticipated d/c date is: > 3 days              Patient currently is not medically stable to d/c.   DVT Prophylaxis  :  Lovenox    Lab Results  Component Value Date   PLT 128 (L) 09/01/2020    Diet :  Diet Order            Diet 2 gram sodium Room service appropriate? Yes; Fluid consistency: Thin  Diet effective now                  Inpatient Medications  Scheduled Meds: . vitamin C with rose hips  1,000 mg Oral Daily  . aspirin  81 mg Oral Daily  .  cholecalciferol  1,000 Units Oral Daily  . enoxaparin (LOVENOX) injection  60 mg Subcutaneous Q24H  . insulin aspart  0-15 Units Subcutaneous TID WC  . insulin aspart  0-5 Units Subcutaneous QHS  . Ipratropium-Albuterol  1 puff Inhalation Q6H  . methylPREDNISolone (SOLU-MEDROL) injection  60 mg Intravenous Q12H  . pantoprazole  40 mg Oral Daily  . zinc sulfate  220 mg Oral Daily   Continuous Infusions: . remdesivir 100 mg in NS 100 mL Stopped (09/01/20 0837)   PRN Meds:.acetaminophen, guaiFENesin-dextromethorphan, hydrALAZINE, [DISCONTINUED] ondansetron **OR** ondansetron (ZOFRAN) IV, simethicone  Antibiotics  :    Anti-infectives (From admission, onward)   Start     Dose/Rate Route Frequency Ordered Stop   08/31/20 1000  remdesivir 100 mg in sodium chloride 0.9 % 100 mL IVPB       "Followed by" Linked Group Details   100 mg 200 mL/hr over 30 Minutes Intravenous Daily 08/30/20 1742 09/04/20 0959   08/30/20 1745  remdesivir 200 mg in sodium chloride 0.9% 250 mL IVPB       "Followed by" Linked Group Details   200 mg 580 mL/hr over 30 Minutes Intravenous Once 08/30/20 1742 08/30/20 2121       Time Spent in minutes 35   Darliss Cheney M.D on 09/01/2020 at 12:52 PM  To page go to [email or web address]   [facility] -  Office  [phone]    See all Orders from today for further details    Objective:   Vitals:   09/01/20 0307 09/01/20 0744 09/01/20 0855 09/01/20 1137   BP: (!) 142/63 113/67  120/76  Pulse: (!) 59 68  61  Resp: (!) 22 20  18   Temp: 97.8 F (36.6 C) 97.8 F (36.6 C)  98.6 F (37 C)  TempSrc: Oral Oral  Oral  SpO2: 90% 92% 91% 90%  Weight:      Height:        Wt Readings from Last 3 Encounters:  08/31/20 119.7 kg  08/07/17 (!) 217.7 kg  04/03/16 (!) 217.7 kg     Intake/Output Summary (Last 24 hours) at 09/01/2020 1252 Last data filed at 09/01/2020 [id number] Gross per 24 hour  Intake 100 ml  Output 450 ml  Net -350 ml     Physical Exam  General exam: Appears calm and comfortable, morbidly obese Respiratory system: Diminished breath sounds bilaterally. Respiratory effort normal. Cardiovascular system: S1 & S2 heard, RRR. No JVD, murmurs, rubs, gallops or clicks. No pedal edema. Gastrointestinal system: Abdomen is nondistended, soft and nontender. No organomegaly or masses felt. Normal bowel sounds heard. Central nervous system: Alert and oriented. No focal neurological deficits. Extremities: Symmetric 5 x 5 power. Skin: No rashes, lesions or ulcers.  Psychiatry: Judgement and insight appear normal. Mood & affect appropriate.     Data Review:    CBC Recent Labs  Lab 08/30/20 1313 08/31/20 0409 09/01/20 0103  WBC 9.1 9.5 5.1  HGB 14.2 13.5 14.0  HCT 43.5 40.7 43.1  PLT 96* 88* 128*  MCV 84.8 84.8 83.5  MCH 27.7 28.1 27.1  MCHC 32.6 33.2 32.5  RDW 13.8 13.7 13.4  LYMPHSABS  --  1.0 0.3*  MONOABS  --  0.4 0.2  EOSABS  --  0.0 0.0  BASOSABS  --  0.0 0.0    Recent Labs  Lab 08/30/20 1313 08/30/20 1820 08/30/20 1821 08/31/20 0409 08/31/20 0815 09/01/20 0103  NA 137  --   --  136  --  137  K  3.8  --   --  3.6  --  3.6  CL 99  --   --  100  --  100  CO2 27  --   --  25  --  28  GLUCOSE 130*  --   --  116*  --  137*  BUN 15  --   --  15  --  20  CREATININE 1.16  --   --  0.94  --  0.86  CALCIUM 8.7*  --   --  8.2*  --  8.5*  AST  --  48*  --  46*  --  42*  ALT  --  29  --  27  --  29  ALKPHOS  --  55   --  50  --  52  BILITOT  --  1.4*  --  1.1  --  1.1  ALBUMIN  --  3.0*  --  2.5*  --  2.4*  MG  --   --   --  2.1  --  2.2  CRP  --  21.0*  --  19.3*  --  19.2*  DDIMER  --  1.17*  --  1.21*  --  1.71*  PROCALCITON  --  0.44  --   --  0.71 0.51  LATICACIDVEN  --   --  1.2  --   --   --   HGBA1C  --   --   --   --  5.1  --   BNP  --   --   --   --  117.7* 80.0    ------------------------------------------------------------------------------------------------------------------ Recent Labs    08/30/20 1800  TRIG 64    Lab Results  Component Value Date   HGBA1C 5.1 08/31/2020   ------------------------------------------------------------------------------------------------------------------ No results for input(s): TSH, T4TOTAL, T3FREE, THYROIDAB in the last 72 hours.  Invalid input(s): FREET3  Cardiac Enzymes No results for input(s): CKMB, TROPONINI, MYOGLOBIN in the last 168 hours.  Invalid input(s): CK ------------------------------------------------------------------------------------------------------------------    Component Value Date/Time   BNP 80.0 09/01/2020 0103    Micro Results Recent Results (from the past 240 hour(s))  Resp Panel by RT-PCR (Flu A&B, Covid) Nasopharyngeal Swab     Status: Abnormal   Collection Time: 08/30/20  1:12 PM   Specimen: Nasopharyngeal Swab; Nasopharyngeal(NP) swabs in vial transport medium  Result Value Ref Range Status   SARS Coronavirus 2 by RT PCR POSITIVE (A) NEGATIVE Final    Comment: emailed L. Berdik RN 14:20 08/30/20 ([email or web address]) (NOTE) SARS-CoV-2 target nucleic acids are DETECTED.  The SARS-CoV-2 RNA is generally detectable in upper respiratory specimens during the acute phase of infection. Positive results are indicative of the presence of the identified virus, but do not rule out bacterial infection or co-infection with other pathogens not detected by the test. Clinical correlation with patient history and other  diagnostic information is necessary to determine patient infection status. The expected result is Negative.  Fact Sheet for Patients: BloggerCourse.com  Fact Sheet for Healthcare Providers: SeriousBroker.it  This test is not yet approved or cleared by the [place] FDA and  has been authorized for detection and/or diagnosis of SARS-CoV-2 by FDA under an Emergency Use Authorization (EUA).  This EUA will remain in effect (meaning this test can be used) for the duration of  the COVID- 19 declaration under Section 564(b)(1) of the Act, 21 U.S.C. section 360bbb-3(b)(1), unless the authorization is terminated or revoked sooner.     Influenza A by  PCR NEGATIVE NEGATIVE Final   Influenza B by PCR NEGATIVE NEGATIVE Final    Comment: (NOTE) The Xpert Xpress SARS-CoV-2/FLU/RSV plus assay is intended as an aid in the diagnosis of influenza from Nasopharyngeal swab specimens and should not be used as a sole basis for treatment. Nasal washings and aspirates are unacceptable for Xpert Xpress SARS-CoV-2/FLU/RSV testing.  Fact Sheet for Patients: EntrepreneurPulse.com.au  Fact Sheet for Healthcare Providers: IncredibleEmployment.be  This test is not yet approved or cleared by the [place] FDA and has been authorized for detection and/or diagnosis of SARS-CoV-2 by FDA under an Emergency Use Authorization (EUA). This EUA will remain in effect (meaning this test can be used) for the duration of the COVID-19 declaration under Section 564(b)(1) of the Act, 21 U.S.C. section 360bbb-3(b)(1), unless the authorization is terminated or revoked.  Performed at [facility], [place] [address] [place], [place] [address]   Blood Culture (routine x 2)     Status: None (Preliminary result)   Collection Time: 08/30/20  4:04 PM   Specimen: BLOOD RIGHT ARM  Result Value Ref Range Status   Specimen Description  BLOOD RIGHT ARM  Final   Special Requests   Final    BOTTLES DRAWN AEROBIC AND ANAEROBIC Blood Culture adequate volume   Culture   Final    NO GROWTH 2 DAYS Performed at [facility], [place] [address], [place], [place] [address]    Report Status PENDING  Incomplete  Blood Culture (routine x 2)     Status: None (Preliminary result)   Collection Time: 08/31/20  8:15 AM   Specimen: BLOOD LEFT WRIST  Result Value Ref Range Status   Specimen Description BLOOD LEFT WRIST  Final   Special Requests   Final    BOTTLES DRAWN AEROBIC AND ANAEROBIC Blood Culture results may not be optimal due to an inadequate volume of blood received in culture bottles   Culture   Final    NO GROWTH < 24 HOURS Performed at [facility], [place] [address] [place], [place] [address]    Report Status PENDING  Incomplete    Radiology Reports DG Chest Portable 1 View  Result Date: 08/30/2020 CLINICAL DATA:  54 year old male with hypoxia. Positive COVID-19. EXAM: PORTABLE CHEST 1 VIEW COMPARISON:  Chest radiograph dated 11/18/2010. FINDINGS: There is cardiomegaly with vascular congestion. Bilateral mid to lower lung field predominant hazy opacities consistent with multifocal pneumonia and in keeping with COVID-19. Clinical correlation is recommended. There is no pleural effusion pneumothorax. No acute osseous pathology. IMPRESSION: 1. Multifocal pneumonia. 2. Cardiomegaly with vascular congestion. Electronically Signed   By: Anner Crete M.D.   On: 08/30/2020 16:09   ECHOCARDIOGRAM LIMITED  Result Date: 08/31/2020    ECHOCARDIOGRAM LIMITED REPORT   Patient Name:   AHSIR SIMA Date of Exam: 08/31/2020 Medical Rec #:  [id number]       Height:       72.0 in Accession #:    [id number]      Weight:       272.1 lb Date of Birth:  10/17/1965       BSA:          2.428 m Patient Age:    20 years        BP:           125/72 mmHg Patient Gender: M               HR:  68 bpm. Exam Location:  Inpatient  Procedure: Limited Echo, Cardiac Doppler and Color Doppler Indications:    CHF-Acute Diastolic 428.31 / I50.31  History:        Patient has no prior history of Echocardiogram examinations.                 Risk Factors:Hypertension and Non-Smoker.  Sonographer:    Renella Cunas RDCS Referring Phys: [id number] [facility]  Sonographer Comments: Covid positive. IMPRESSIONS  1. Left ventricular ejection fraction, by estimation, is 55 to 60%. The left ventricle has normal function. The left ventricle has no regional wall motion abnormalities. Left ventricular diastolic parameters were normal.  2. Right ventricular systolic function is normal. The right ventricular size is normal. There is normal pulmonary artery systolic pressure.  3. The mitral valve is normal in structure. Trivial mitral valve regurgitation. No evidence of mitral stenosis.  4. The aortic valve is normal in structure. Aortic valve regurgitation is not visualized. No aortic stenosis is present.  5. The inferior vena cava is normal in size with greater than 50% respiratory variability, suggesting right atrial pressure of 3 mmHg. FINDINGS  Left Ventricle: Left ventricular ejection fraction, by estimation, is 55 to 60%. The left ventricle has normal function. The left ventricle has no regional wall motion abnormalities. The left ventricular internal cavity size was normal in size. There is  borderline concentric left ventricular hypertrophy. Left ventricular diastolic parameters were normal. Normal left ventricular filling pressure. Right Ventricle: The right ventricular size is normal. No increase in right ventricular wall thickness. Right ventricular systolic function is normal. There is normal pulmonary artery systolic pressure. The tricuspid regurgitant velocity is 1.89 m/s, and  with an assumed right atrial pressure of 3 mmHg, the estimated right ventricular systolic pressure is 17.3 mmHg. Left Atrium: Left atrial size was normal in size. Right Atrium:  Right atrial size was normal in size. Pericardium: There is no evidence of pericardial effusion. Mitral Valve: The mitral valve is normal in structure. Trivial mitral valve regurgitation. No evidence of mitral valve stenosis. Tricuspid Valve: The tricuspid valve is normal in structure. Tricuspid valve regurgitation is not demonstrated. No evidence of tricuspid stenosis. Aortic Valve: The aortic valve is normal in structure. Aortic valve regurgitation is not visualized. No aortic stenosis is present. Pulmonic Valve: The pulmonic valve was normal in structure. Pulmonic valve regurgitation is not visualized. No evidence of pulmonic stenosis. Aorta: The aortic root is normal in size and structure. Venous: The inferior vena cava is normal in size with greater than 50% respiratory variability, suggesting right atrial pressure of 3 mmHg. IAS/Shunts: No atrial level shunt detected by color flow Doppler. LEFT VENTRICLE PLAX 2D LVIDd:         5.28 cm      Diastology LVIDs:         3.70 cm      LV e' medial:    6.41 cm/s LV PW:         1.10 cm      LV E/e' medial:  6.5 LV IVS:        1.10 cm      LV e' lateral:   8.51 cm/s LVOT diam:     2.40 cm      LV E/e' lateral: 4.9 LV SV:         79 LV SV Index:   33 LVOT Area:     4.52 cm  LV Volumes (MOD) LV vol d, MOD A2C: 197.0 ml  LV vol d, MOD A4C: 232.0 ml LV vol s, MOD A2C: 81.4 ml LV vol s, MOD A4C: 111.0 ml LV SV MOD A2C:     115.6 ml LV SV MOD A4C:     232.0 ml LV SV MOD BP:      120.7 ml RIGHT VENTRICLE RV S prime:     15.50 cm/s TAPSE (M-mode): 3.2 cm LEFT ATRIUM         Index LA diam:    3.60 cm 1.48 cm/m  AORTIC VALVE LVOT Vmax:   91.20 cm/s LVOT Vmean:  65.900 cm/s LVOT VTI:    0.175 m  AORTA Ao Root diam: 3.60 cm MITRAL VALVE               TRICUSPID VALVE MV Area (PHT): 2.99 cm    TR Peak grad:   14.3 mmHg MV Decel Time: 254 msec    TR Vmax:        189.00 cm/s MV E velocity: 41.90 cm/s MV A velocity: 36.10 cm/s  SHUNTS MV E/A ratio:  1.16        Systemic VTI:  0.18 m                             Systemic Diam: 2.40 cm Dani Gobble Croitoru MD Electronically signed by Sanda Klein MD Signature Date/Time: 08/31/2020/3:07:43 PM    Final

## 2020-09-01 NOTE — Progress Notes (Signed)
HR is 44 at rest.  MD notified. Will continue to monitor

## 2020-09-01 NOTE — Evaluation (Signed)
Physical Therapy Evaluation Patient Details Name: Todd [name] MRN: [id number] DOB: 03-08-1966 Today's Date: 09/01/2020   History of Present Illness  54 y.o. male with medical history significant of HTN, morbid obesity status post gastric bypass in 2018, arthritis, presented with new onset of shortness of breath.  COVID + (unvaccinated); requiring HHFNC 35L/100%; dehydration  Clinical Impression   Pt admitted with above diagnosis. Patient normally independent and works 60+ hours/week. He currently requires 15L HFNC plus 15L NRB to tolerate ambulation to bathroom with sats down to 79%. He recovers to upper 80s in ~3 minutes and in low 90s after 4 minutes.  Pt currently with functional limitations due to the deficits listed below (see PT Problem List). Pt will benefit from skilled PT to increase their independence and safety with mobility to allow discharge to the venue listed below.       Follow Up Recommendations No PT follow up    Equipment Recommendations  None recommended by PT    Recommendations for Other Services OT consult     Precautions / Restrictions Precautions Precautions: Other (comment) Precaution Comments: 15L HFNC + NRB      Mobility  Bed Mobility Overal bed mobility: Modified Independent             General bed mobility comments: HOB 25 no rail    Transfers Overall transfer level: Needs assistance Equipment used: None Transfers: Sit to/from Stand Sit to Stand: Min guard         General transfer comment: for safety, esp with lines  Ambulation/Gait Ambulation/Gait assistance: Min guard Gait Distance (Feet): 25 Feet (x2) Assistive device: None Gait Pattern/deviations: Step-through pattern;Wide base of support     General Gait Details: wide BOS due to body habitus and slow velocity required by decr sats  Stairs            Wheelchair Mobility    Modified Rankin (Stroke Patients Only)       Balance Overall balance assessment: No  apparent balance deficits (not formally assessed)                                           Pertinent Vitals/Pain Pain Assessment: No/denies pain    Home Living Family/patient expects to be discharged to:: Private residence Living Arrangements: Spouse/significant other;Other (Comment) (son home from school) Available Help at Discharge: Family Type of Home: House Home Access: Level entry     Home Layout: Two level;Able to live on main level with bedroom/bathroom Home Equipment: Kasandra Knudsen - single point      Prior Function Level of Independence: Independent         Comments: chemical processor; 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[address] [place]        Extremity/Trunk Assessment   Upper Extremity Assessment Upper Extremity Assessment: Defer to OT evaluation    Lower Extremity Assessment Lower Extremity Assessment: Generalized weakness    Cervical / Trunk Assessment Cervical / Trunk Assessment: Other exceptions Cervical / Trunk Exceptions: overweight  Communication   Communication: No difficulties  Cognition Arousal/Alertness: Awake/alert Behavior During Therapy: WFL for tasks assessed/performed Overall Cognitive Status: Within Functional Limits for tasks assessed                                        General  Comments General comments (skin integrity, edema, etc.): Patient initially only on 15L HFNC and walked 4 ft bed to chair with sats down to 83% and slowly increasing to 86% with NRB then added back with sats recovering to 91%. Patient insisting on walking to the bathroom as he reports he can't use a BSC (and ultimately he did not have a BSC in his room). RN in to assist with ambulation to bathroom on 15L HFNC+NRB with sats down to 79% and slow recovery while seated on toilet. Similar response on walk to return to chair. Educated on need to sit up OOB as much as possible    Exercises Other Exercises Other Exercises: Use of IS and flutter valve (5  reps each) and education on frequency and purpose of each.   Assessment/Plan    PT Assessment Patient needs continued PT services  PT Problem List Decreased activity tolerance;Decreased mobility;Decreased knowledge of use of DME;Decreased knowledge of precautions;Cardiopulmonary status limiting activity;Obesity       PT Treatment Interventions DME instruction;Gait training;Functional mobility training;Therapeutic activities;Therapeutic exercise;Patient/family education    PT Goals (Current goals can be found in the Care Plan section)  Acute Rehab PT Goals Patient Stated Goal: return home ASAP PT Goal Formulation: With patient Time For Goal Achievement: 09/15/20 Potential to Achieve Goals: Good    Frequency Min 3X/week   Barriers to discharge        Co-evaluation               AM-PAC PT "6 Clicks" Mobility  Outcome Measure Help needed turning from your back to your side while in a flat bed without using bedrails?: None Help needed moving from lying on your back to sitting on the side of a flat bed without using bedrails?: None Help needed moving to and from a bed to a chair (including a wheelchair)?: A Little Help needed standing up from a chair using your arms (e.g., wheelchair or bedside chair)?: A Little Help needed to walk in hospital room?: A Little Help needed climbing 3-5 steps with a railing? : Total 6 Click Score: 18    End of Session Equipment Utilized During Treatment: Oxygen (2 tanks to get to bathroom) Activity Tolerance: Treatment limited secondary to medical complications (Comment) (desats) Patient left: in chair;with call bell/phone within reach;Other (comment) (chair alarm pad under him; alarm in room not working; NT made aware) Nurse Communication: Mobility status PT Visit Diagnosis: Difficulty in walking, not elsewhere classified (R26.2)    Time: 0383-3383 PT Time Calculation (min) (ACUTE ONLY): 53 min   Charges:   PT Evaluation $PT Eval  Moderate Complexity: 1 Mod PT Treatments $Gait Training: 8-22 mins $Therapeutic Activity: 8-22 mins         Jerolyn Center, PT Pager [phone]   Zena Amos 09/01/2020, 2:05 PM

## 2020-09-02 DIAGNOSIS — Z1152 Encounter for screening for COVID-19: Secondary | ICD-10-CM | POA: Diagnosis not present

## 2020-09-02 DIAGNOSIS — B349 Viral infection, unspecified: Secondary | ICD-10-CM | POA: Diagnosis not present

## 2020-09-02 LAB — COMPREHENSIVE METABOLIC PANEL
ALT: 31 U/L (ref 0–44)
AST: 34 U/L (ref 15–41)
Albumin: 2.3 g/dL — ABNORMAL LOW (ref 3.5–5.0)
Alkaline Phosphatase: 48 U/L (ref 38–126)
Anion gap: 8 (ref 5–15)
BUN: 23 mg/dL — ABNORMAL HIGH (ref 6–20)
CO2: 29 mmol/L (ref 22–32)
Calcium: 8.4 mg/dL — ABNORMAL LOW (ref 8.9–10.3)
Chloride: 100 mmol/L (ref 98–111)
Creatinine, Ser: 0.99 mg/dL (ref 0.61–1.24)
GFR, Estimated: >60 mL/min (ref >60)
Glucose, Bld: 156 mg/dL — ABNORMAL HIGH (ref 70–99)
Potassium: 4.1 mmol/L (ref 3.5–5.1)
Sodium: 137 mmol/L (ref 135–145)
Total Bilirubin: 1 mg/dL (ref 0.3–1.2)
Total Protein: 5.8 g/dL — ABNORMAL LOW (ref 6.5–8.1)

## 2020-09-02 LAB — GLUCOSE, CAPILLARY
Glucose-Capillary: 134 mg/dL — ABNORMAL HIGH (ref 70–99)
Glucose-Capillary: 115 mg/dL — ABNORMAL HIGH (ref 70–99)
Glucose-Capillary: 107 mg/dL — ABNORMAL HIGH (ref 70–99)
Glucose-Capillary: 85 mg/dL (ref 70–99)

## 2020-09-02 LAB — PROCALCITONIN: Procalcitonin: 0.33 ng/mL

## 2020-09-02 LAB — CBC WITH DIFFERENTIAL/PLATELET
Abs Immature Granulocytes: 0.05 10*3/uL (ref 0.00–0.07)
Basophils Absolute: 0 10*3/uL (ref 0.0–0.1)
Basophils Relative: 0 %
Eosinophils Absolute: 0 10*3/uL (ref 0.0–0.5)
Eosinophils Relative: 0 %
HCT: 42 % (ref 39.0–52.0)
Hemoglobin: 13.9 g/dL (ref 13.0–17.0)
Immature Granulocytes: 1 %
Lymphocytes Relative: 14 %
Lymphs Abs: 1.2 10*3/uL (ref 0.7–4.0)
MCH: 27.5 pg (ref 26.0–34.0)
MCHC: 33.1 g/dL (ref 30.0–36.0)
MCV: 83 fL (ref 80.0–100.0)
Monocytes Absolute: 0.4 10*3/uL (ref 0.1–1.0)
Monocytes Relative: 4 %
Neutro Abs: 7.2 10*3/uL (ref 1.7–7.7)
Neutrophils Relative %: 81 %
Platelets: 149 10*3/uL — ABNORMAL LOW (ref 150–400)
RBC: 5.06 MIL/uL (ref 4.22–5.81)
RDW: 13.6 % (ref 11.5–15.5)
WBC: 8.8 10*3/uL (ref 4.0–10.5)
nRBC: 0 % (ref 0.0–0.2)

## 2020-09-02 LAB — D-DIMER, QUANTITATIVE: D-Dimer, Quant: 1.92 ug/mL-FEU — ABNORMAL HIGH (ref 0.00–0.50)

## 2020-09-02 LAB — BRAIN NATRIURETIC PEPTIDE: B Natriuretic Peptide: 86.2 pg/mL (ref 0.0–100.0)

## 2020-09-02 LAB — MAGNESIUM: Magnesium: 2.3 mg/dL (ref 1.7–2.4)

## 2020-09-02 LAB — HEPATITIS B SURFACE ANTIGEN: Hepatitis B Surface Ag: NONREACTIVE

## 2020-09-02 LAB — C-REACTIVE PROTEIN: CRP: 8.2 mg/dL — ABNORMAL HIGH (ref <1.0)

## 2020-09-02 NOTE — Progress Notes (Signed)
PROGRESS NOTE                                                                                                                                                                                                             Patient Demographics:    Todd [name], is a 54 y.o. [name], DOB - 1966-01-14, FUX:[id number]  Outpatient Primary MD for the patient is Renford Dills, MD    LOS - 3  Admit date - 08/30/2020    Chief Complaint  Patient presents with  . Shortness of Breath       Brief Narrative (HPI from H&P) Todd [name] is a 54 y.o. [name] with medical history significant of HTN, morbid obesity status post gastric bypass in 2018, arthritis, presented with new onset of shortness of breath.  Symptoms started 5 days before hospital visit, he was diagnosed with acute hypoxic respiratory failure due to COVID-19 pneumonia and admitted to the hospital   Subjective:   Patient seen and examined.  He states that he is getting better.  Still on 15 L high flow oxygen.  Able to speak better today and looks brighter today.   Assessment  & Plan :    Addendum - reeval in ER 12.30 pm - 5 hrs after Actemra, much better, now on 20 lits [place] (from 35 lits) and feels better.   1. Acute Hypoxic Resp. Failure due to Acute Covid 19 Viral Pneumonitis during the ongoing 2020 Covid 19 Pandemic - he is unfortunately not vaccinated and has incurred severe parenchymal lung injury due to COVID-19 pneumonia causing ARDS.  He has been placed on high-dose IV steroids, Remdesivir along with Actemra after proper consent.  Monitor him closely very tenuous.  Currently on 15 L high flow oxygen.  Encouraged the patient to sit up in chair in the daytime use I-S and flutter valve for pulmonary toiletry and then prone in bed when at night.  Will advance activity and titrate down oxygen as possible.  Actemra/Baricitinib  off label use - patient was told that if COVID-19  pneumonitis gets worse we might potentially use Actemra off label, patient denies any known history of active diverticulitis, tuberculosis or hepatitis, understands the risks and benefits and wants to proceed with Actemra treatment if required.     SpO2: 90 % O2 Flow Rate (L/min): 15 L/min FiO2 (%):  100 %  Recent Labs  Lab 08/30/20 1312 08/30/20 1313 08/30/20 1820 08/30/20 1821 08/31/20 0409 08/31/20 0815 09/01/20 0103 09/02/20 0054  WBC  --  9.1  --   --  9.5  --  5.1 8.8  HGB  --  14.2  --   --  13.5  --  14.0 13.9  HCT  --  43.5  --   --  40.7  --  43.1 42.0  PLT  --  96*  --   --  88*  --  128* 149*  CRP  --   --  21.0*  --  19.3*  --  19.2* 8.2*  BNP  --   --   --   --   --  117.7* 80.0 86.2  DDIMER  --   --  1.17*  --  1.21*  --  1.71* 1.92*  PROCALCITON  --   --  0.44  --   --  0.71 0.51 0.33  AST  --   --  48*  --  46*  --  42* 34  ALT  --   --  29  --  27  --  29 31  ALKPHOS  --   --  55  --  50  --  52 48  BILITOT  --   --  1.4*  --  1.1  --  1.1 1.0  ALBUMIN  --   --  3.0*  --  2.5*  --  2.4* 2.3*  LATICACIDVEN  --   --   --  1.2  --   --   --   --   SARSCOV2NAA POSITIVE*  --   --   --   --   --   --   --       2.  Severe dehydration.  Resolved.  3. HX of gastric bypass.  Supportive care.  4.  GERD.  PPI.  5. Hypertension.  Blood pressure well controlled.  Continue as needed hydralazine.  6.  Hypoxia induced mild bump in troponin, chest pain-free, EKG nonacute.  Is on low-dose aspirin and echo shows normal ejection fraction, no WMA.  7.  Epistaxis: Brief episode on 09/01/2020.  No further episodes.  Monitor closely.  Condition - Extremely Guarded  Family Communication  : Patient will update his family himself.  Code Status :  Full  Consults  :  None  Procedures  :    PUD Prophylaxis : PPI  Disposition Plan  : To be determined.  May need several more days in the hospital.  Status is: Inpatient  Remains inpatient appropriate because:IV  treatments appropriate due to intensity of illness or inability to take PO   Dispo: The patient is from: Home              Anticipated d/c is to: Home              Anticipated d/c date is: > 3 days              Patient currently is not medically stable to d/c.   DVT Prophylaxis  :  Lovenox    Lab Results  Component Value Date   PLT 149 (L) 09/02/2020    Diet :  Diet Order            Diet 2 gram sodium Room service appropriate? Yes; Fluid consistency: Thin  Diet effective now  Inpatient Medications  Scheduled Meds: . vitamin C with rose hips  1,000 mg Oral Daily  . aspirin  81 mg Oral Daily  . cholecalciferol  1,000 Units Oral Daily  . enoxaparin (LOVENOX) injection  60 mg Subcutaneous Q24H  . insulin aspart  0-15 Units Subcutaneous TID WC  . insulin aspart  0-5 Units Subcutaneous QHS  . Ipratropium-Albuterol  1 puff Inhalation Q6H  . methylPREDNISolone (SOLU-MEDROL) injection  60 mg Intravenous Q12H  . pantoprazole  40 mg Oral Daily  . zinc sulfate  220 mg Oral Daily   Continuous Infusions: . remdesivir 100 mg in NS 100 mL Stopped (09/02/20 0837)   PRN Meds:.acetaminophen, guaiFENesin-dextromethorphan, hydrALAZINE, [DISCONTINUED] ondansetron **OR** ondansetron (ZOFRAN) IV, simethicone  Antibiotics  :    Anti-infectives (From admission, onward)   Start     Dose/Rate Route Frequency Ordered Stop   08/31/20 1000  remdesivir 100 mg in sodium chloride 0.9 % 100 mL IVPB       "Followed by" Linked Group Details   100 mg 200 mL/hr over 30 Minutes Intravenous Daily 08/30/20 1742 09/04/20 0959   08/30/20 1745  remdesivir 200 mg in sodium chloride 0.9% 250 mL IVPB       "Followed by" Linked Group Details   200 mg 580 mL/hr over 30 Minutes Intravenous Once 08/30/20 1742 08/30/20 2121       Time Spent in minutes 30   Darliss Cheney M.D on 09/02/2020 at 12:53 PM  To page go to [email or web address]   [facility] -  Office  [phone]    See all  Orders from today for further details    Objective:   Vitals:   09/01/20 2037 09/02/20 0426 09/02/20 0744 09/02/20 1216  BP: 105/78 114/64 (!) 146/81 115/68  Pulse: 85 67 77 73  Resp: 20 18 20 20   Temp: 98.1 F (36.7 C) 98.2 F (36.8 C) 98.3 F (36.8 C) 98.6 F (37 C)  TempSrc: Oral Oral Oral Axillary  SpO2: 92% 91% 94% 90%  Weight:      Height:        Wt Readings from Last 3 Encounters:  08/31/20 119.7 kg  08/07/17 (!) 217.7 kg  04/03/16 (!) 217.7 kg     Intake/Output Summary (Last 24 hours) at 09/02/2020 1253 Last data filed at 09/02/2020 0900 Gross per 24 hour  Intake 820 ml  Output 400 ml  Net 420 ml     Physical Exam  General exam: Appears calm and comfortable, obese Respiratory system: Diminished breath sounds bilaterally. Respiratory effort normal. Cardiovascular system: S1 & S2 heard, RRR. No JVD, murmurs, rubs, gallops or clicks. No pedal edema. Gastrointestinal system: Abdomen is nondistended, soft and nontender. No organomegaly or masses felt. Normal bowel sounds heard. Central nervous system: Alert and oriented. No focal neurological deficits. Extremities: Symmetric 5 x 5 power. Skin: No rashes, lesions or ulcers.  Psychiatry: Judgement and insight appear normal. Mood & affect appropriate.     Data Review:    CBC Recent Labs  Lab 08/30/20 1313 08/31/20 0409 09/01/20 0103 09/02/20 0054  WBC 9.1 9.5 5.1 8.8  HGB 14.2 13.5 14.0 13.9  HCT 43.5 40.7 43.1 42.0  PLT 96* 88* 128* 149*  MCV 84.8 84.8 83.5 83.0  MCH 27.7 28.1 27.1 27.5  MCHC 32.6 33.2 32.5 33.1  RDW 13.8 13.7 13.4 13.6  LYMPHSABS  --  1.0 0.3* 1.2  MONOABS  --  0.4 0.2 0.4  EOSABS  --  0.0 0.0 0.0  BASOSABS  --  0.0 0.0 0.0    Recent Labs  Lab 08/30/20 1313 08/30/20 1820 08/30/20 1821 08/31/20 0409 08/31/20 0815 09/01/20 0103 09/02/20 0054  NA 137  --   --  136  --  137 137  K 3.8  --   --  3.6  --  3.6 4.1  CL 99  --   --  100  --  100 100  CO2 27  --   --  25   --  28 29  GLUCOSE 130*  --   --  116*  --  137* 156*  BUN 15  --   --  15  --  20 23*  CREATININE 1.16  --   --  0.94  --  0.86 0.99  CALCIUM 8.7*  --   --  8.2*  --  8.5* 8.4*  AST  --  48*  --  46*  --  42* 34  ALT  --  29  --  27  --  29 31  ALKPHOS  --  55  --  50  --  52 48  BILITOT  --  1.4*  --  1.1  --  1.1 1.0  ALBUMIN  --  3.0*  --  2.5*  --  2.4* 2.3*  MG  --   --   --  2.1  --  2.2 2.3  CRP  --  21.0*  --  19.3*  --  19.2* 8.2*  DDIMER  --  1.17*  --  1.21*  --  1.71* 1.92*  PROCALCITON  --  0.44  --   --  0.71 0.51 0.33  LATICACIDVEN  --   --  1.2  --   --   --   --   HGBA1C  --   --   --   --  5.1  --   --   BNP  --   --   --   --  117.7* 80.0 86.2    ------------------------------------------------------------------------------------------------------------------ Recent Labs    08/30/20 1800  TRIG 64    Lab Results  Component Value Date   HGBA1C 5.1 08/31/2020   ------------------------------------------------------------------------------------------------------------------ No results for input(s): TSH, T4TOTAL, T3FREE, THYROIDAB in the last 72 hours.  Invalid input(s): FREET3  Cardiac Enzymes No results for input(s): CKMB, TROPONINI, MYOGLOBIN in the last 168 hours.  Invalid input(s): CK ------------------------------------------------------------------------------------------------------------------    Component Value Date/Time   BNP 86.2 09/02/2020 0054    Micro Results Recent Results (from the past 240 hour(s))  Resp Panel by RT-PCR (Flu A&B, Covid) Nasopharyngeal Swab     Status: Abnormal   Collection Time: 08/30/20  1:12 PM   Specimen: Nasopharyngeal Swab; Nasopharyngeal(NP) swabs in vial transport medium  Result Value Ref Range Status   SARS Coronavirus 2 by RT PCR POSITIVE (A) NEGATIVE Final    Comment: emailed L. Berdik RN 14:20 08/30/20 ([email or web address]) (NOTE) SARS-CoV-2 target nucleic acids are DETECTED.  The SARS-CoV-2 RNA is generally  detectable in upper respiratory specimens during the acute phase of infection. Positive results are indicative of the presence of the identified virus, but do not rule out bacterial infection or co-infection with other pathogens not detected by the test. Clinical correlation with patient history and other diagnostic information is necessary to determine patient infection status. The expected result is Negative.  Fact Sheet for Patients: EntrepreneurPulse.com.au  Fact Sheet for Healthcare Providers: IncredibleEmployment.be  This test is not yet approved or cleared by the [place] and  has been authorized for detection and/or diagnosis of SARS-CoV-2 by FDA under an Emergency Use Authorization (EUA).  This EUA will remain in effect (meaning this test can be used) for the duration of  the COVID- 19 declaration under Section 564(b)(1) of the Act, 21 U.S.C. section 360bbb-3(b)(1), unless the authorization is terminated or revoked sooner.     Influenza A by PCR NEGATIVE NEGATIVE Final   Influenza B by PCR NEGATIVE NEGATIVE Final    Comment: (NOTE) The Xpert Xpress SARS-CoV-2/FLU/RSV plus assay is intended as an aid in the diagnosis of influenza from Nasopharyngeal swab specimens and should not be used as a sole basis for treatment. Nasal washings and aspirates are unacceptable for Xpert Xpress SARS-CoV-2/FLU/RSV testing.  Fact Sheet for Patients: EntrepreneurPulse.com.au  Fact Sheet for Healthcare Providers: IncredibleEmployment.be  This test is not yet approved or cleared by the [place] FDA and has been authorized for detection and/or diagnosis of SARS-CoV-2 by FDA under an Emergency Use Authorization (EUA). This EUA will remain in effect (meaning this test can be used) for the duration of the COVID-19 declaration under Section 564(b)(1) of the Act, 21 U.S.C. section 360bbb-3(b)(1), unless the  authorization is terminated or revoked.  Performed at [facility], [place] [address], [place], [place] [address]   Blood Culture (routine x 2)     Status: None (Preliminary result)   Collection Time: 08/30/20  4:04 PM   Specimen: BLOOD RIGHT ARM  Result Value Ref Range Status   Specimen Description BLOOD RIGHT ARM  Final   Special Requests   Final    BOTTLES DRAWN AEROBIC AND ANAEROBIC Blood Culture adequate volume   Culture   Final    NO GROWTH 3 DAYS Performed at [facility], [place] [address], [place], [place] [address]    Report Status PENDING  Incomplete  Blood Culture (routine x 2)     Status: None (Preliminary result)   Collection Time: 08/31/20  8:15 AM   Specimen: BLOOD LEFT WRIST  Result Value Ref Range Status   Specimen Description BLOOD LEFT WRIST  Final   Special Requests   Final    BOTTLES DRAWN AEROBIC AND ANAEROBIC Blood Culture results may not be optimal due to an inadequate volume of blood received in culture bottles   Culture   Final    NO GROWTH 2 DAYS Performed at [facility], [place] [address] [place], [place] [address]    Report Status PENDING  Incomplete    Radiology Reports DG Chest Portable 1 View  Result Date: 08/30/2020 CLINICAL DATA:  54 year old [name] with hypoxia. Positive COVID-19. EXAM: PORTABLE CHEST 1 VIEW COMPARISON:  Chest radiograph dated 11/18/2010. FINDINGS: There is cardiomegaly with vascular congestion. Bilateral mid to lower lung field predominant hazy opacities consistent with multifocal pneumonia and in keeping with COVID-19. Clinical correlation is recommended. There is no pleural effusion pneumothorax. No acute osseous pathology. IMPRESSION: 1. Multifocal pneumonia. 2. Cardiomegaly with vascular congestion. Electronically Signed   By: Anner Crete M.D.   On: 08/30/2020 16:09   ECHOCARDIOGRAM LIMITED  Result Date: 08/31/2020    ECHOCARDIOGRAM LIMITED REPORT   Patient Name:   Todd [name] Date of Exam: 08/31/2020  Medical Rec #:  [id number]       Height:       72.0 in Accession #:    [id number]      Weight:       272.1 lb Date of Birth:  [birth year]  BSA:          2.428 m Patient Age:    65 years        BP:           125/72 mmHg Patient Gender: M               HR:           68 bpm. Exam Location:  Inpatient Procedure: Limited Echo, Cardiac Doppler and Color Doppler Indications:    CHF-Acute Diastolic [id number] / [id number]  History:        Patient has no prior history of Echocardiogram examinations.                 Risk Factors:Hypertension and Non-Smoker.  Sonographer:    Vickie Epley RDCS Referring Phys: [id number] Margaree Mackintosh [facility]  Sonographer Comments: Covid positive. IMPRESSIONS  1. Left ventricular ejection fraction, by estimation, is 55 to 60%. The left ventricle has normal function. The left ventricle has no regional wall motion abnormalities. Left ventricular diastolic parameters were normal.  2. Right ventricular systolic function is normal. The right ventricular size is normal. There is normal pulmonary artery systolic pressure.  3. The mitral valve is normal in structure. Trivial mitral valve regurgitation. No evidence of mitral stenosis.  4. The aortic valve is normal in structure. Aortic valve regurgitation is not visualized. No aortic stenosis is present.  5. The inferior vena cava is normal in size with greater than 50% respiratory variability, suggesting right atrial pressure of 3 mmHg. FINDINGS  Left Ventricle: Left ventricular ejection fraction, by estimation, is 55 to 60%. The left ventricle has normal function. The left ventricle has no regional wall motion abnormalities. The left ventricular internal cavity size was normal in size. There is  borderline concentric left ventricular hypertrophy. Left ventricular diastolic parameters were normal. Normal left ventricular filling pressure. Right Ventricle: The right ventricular size is normal. No increase in right ventricular wall thickness. Right ventricular systolic  function is normal. There is normal pulmonary artery systolic pressure. The tricuspid regurgitant velocity is 1.89 m/s, and  with an assumed right atrial pressure of 3 mmHg, the estimated right ventricular systolic pressure is [id number]. Left Atrium: Left atrial size was normal in size. Right Atrium: Right atrial size was normal in size. Pericardium: There is no evidence of pericardial effusion. Mitral Valve: The mitral valve is normal in structure. Trivial mitral valve regurgitation. No evidence of mitral valve stenosis. Tricuspid Valve: The tricuspid valve is normal in structure. Tricuspid valve regurgitation is not demonstrated. No evidence of tricuspid stenosis. Aortic Valve: The aortic valve is normal in structure. Aortic valve regurgitation is not visualized. No aortic stenosis is present. Pulmonic Valve: The pulmonic valve was normal in structure. Pulmonic valve regurgitation is not visualized. No evidence of pulmonic stenosis. Aorta: The aortic root is normal in size and structure. Venous: The inferior vena cava is normal in size with greater than 50% respiratory variability, suggesting right atrial pressure of 3 mmHg. IAS/Shunts: No atrial level shunt detected by color flow Doppler. LEFT VENTRICLE PLAX 2D LVIDd:         5.28 cm      Diastology LVIDs:         3.70 cm      LV e' medial:    6.41 cm/s LV PW:         1.10 cm      LV E/e' medial:  6.5 LV IVS:        1.10  cm      LV e' lateral:   8.51 cm/s LVOT diam:     2.40 cm      LV E/e' lateral: 4.9 LV SV:         79 LV SV Index:   33 LVOT Area:     4.52 cm  LV Volumes (MOD) LV vol d, MOD A2C: 197.0 ml LV vol d, MOD A4C: 232.0 ml LV vol s, MOD A2C: 81.4 ml LV vol s, MOD A4C: 111.0 ml LV SV MOD A2C:     115.6 ml LV SV MOD A4C:     232.0 ml LV SV MOD BP:      120.7 ml RIGHT VENTRICLE RV S prime:     15.50 cm/s TAPSE (M-mode): 3.2 cm LEFT ATRIUM         Index LA diam:    3.60 cm 1.48 cm/m  AORTIC VALVE LVOT Vmax:   91.20 cm/s LVOT Vmean:  65.900 cm/s LVOT VTI:     0.175 m  AORTA Ao Root diam: 3.60 cm MITRAL VALVE               TRICUSPID VALVE MV Area (PHT): 2.99 cm    TR Peak grad:   14.3 mmHg MV Decel Time: 254 msec    TR Vmax:        189.00 cm/s MV E velocity: 41.90 cm/s MV A velocity: 36.10 cm/s  SHUNTS MV E/A ratio:  1.16        Systemic VTI:  0.18 m                            Systemic Diam: 2.40 cm Dani Gobble Croitoru MD Electronically signed by Sanda Klein MD Signature Date/Time: 08/31/2020/3:07:43 PM    Final

## 2020-09-02 NOTE — TOC Initial Note (Signed)
Transition of Care [facility]) - Initial/Assessment Note    Patient Details  Name: Todd [name] MRN: [id number] Date of Birth: April 24, 1966  Transition [facility]) CM/SW Contact:    Lockie Pares, RN Phone Number: 09/02/2020, 4:45 PM  Clinical Narrative:                 COVID day 3 unvaccinated COVID pneumonia, on 15 Liters o2, but needs 100% NRB in addition when ambulates. Still desaturates. Patient does not currently have recommendations of HH post hospitalization, patient normally works 60 hours a week Will likely go home with oxygen. CM will follow for needs.   Expected Discharge Plan: Home/Self Care Barriers to Discharge: Continued Medical Work up   Patient Goals and CMS Choice        Expected Discharge Plan and Services Expected Discharge Plan: Home/Self Care   Discharge Planning Services: CM Consult   Living arrangements for the past 2 months: Single Family Home                                      Prior Living Arrangements/Services Living arrangements for the past 2 months: Single Family Home Lives with:: Spouse Patient language and need for interpreter reviewed:: Yes Do you feel safe going back to the place where you live?: Yes      Need for Family Participation in Patient Care: Yes (Comment) Care giver support system in place?: Yes (comment)   Criminal Activity/Legal Involvement Pertinent to Current Situation/Hospitalization: No - Comment as needed  Activities of Daily Living Home Assistive Devices/Equipment: Dentures (specify type),Eyeglasses ADL Screening (condition at time of admission) Patient's cognitive ability adequate to safely complete daily activities?: Yes Is the patient deaf or have difficulty hearing?: No Does the patient have difficulty seeing, even when wearing glasses/contacts?: No Does the patient have difficulty concentrating, remembering, or making decisions?: No Patient able to express need for assistance with ADLs?: Yes Does  the patient have difficulty dressing or bathing?: No Independently performs ADLs?: Yes (appropriate for developmental age) Does the patient have difficulty walking or climbing stairs?: Yes Weakness of Legs: None Weakness of Arms/Hands: None  Permission Sought/Granted                  Emotional Assessment       Orientation: : Oriented to Situation,Oriented to  Time,Oriented to Place,Oriented to Self Alcohol / Substance Use: Not Applicable Psych Involvement: No (comment)  Admission diagnosis:  Hypoxia [R09.02] Pneumonia due to COVID-19 virus [U07.1, J12.82] COVID-19 [U07.1] Patient Active Problem List   Diagnosis Date Noted  . COVID-19 virus infection 08/30/2020  . Pneumonia due to COVID-19 virus 08/30/2020  . Hypoxia   . Special screening for malignant neoplasms, colon 08/07/2017   PCP:  Renford Dills, MD Pharmacy:   [facility] [address] [place] - [address] [place] [address] Phone: [phone] Fax: [phone]     Social Determinants of Health (SDOH) Interventions    Readmission Risk Interventions No flowsheet data found.

## 2020-09-03 LAB — COMPREHENSIVE METABOLIC PANEL
ALT: 38 U/L (ref 0–44)
AST: 41 U/L (ref 15–41)
Albumin: 2.3 g/dL — ABNORMAL LOW (ref 3.5–5.0)
Alkaline Phosphatase: 47 U/L (ref 38–126)
Anion gap: 11 (ref 5–15)
BUN: 27 mg/dL — ABNORMAL HIGH (ref 6–20)
CO2: 26 mmol/L (ref 22–32)
Calcium: 8.1 mg/dL — ABNORMAL LOW (ref 8.9–10.3)
Chloride: 99 mmol/L (ref 98–111)
Creatinine, Ser: 0.96 mg/dL (ref 0.61–1.24)
GFR, Estimated: >60 mL/min (ref >60)
Glucose, Bld: 161 mg/dL — ABNORMAL HIGH (ref 70–99)
Potassium: 3.8 mmol/L (ref 3.5–5.1)
Sodium: 136 mmol/L (ref 135–145)
Total Bilirubin: 0.9 mg/dL (ref 0.3–1.2)
Total Protein: 5.5 g/dL — ABNORMAL LOW (ref 6.5–8.1)

## 2020-09-03 LAB — CBC WITH DIFFERENTIAL/PLATELET
Abs Immature Granulocytes: 0.06 10*3/uL (ref 0.00–0.07)
Basophils Absolute: 0 10*3/uL (ref 0.0–0.1)
Basophils Relative: 0 %
Eosinophils Absolute: 0 10*3/uL (ref 0.0–0.5)
Eosinophils Relative: 0 %
HCT: 42.5 % (ref 39.0–52.0)
Hemoglobin: 13.7 g/dL (ref 13.0–17.0)
Immature Granulocytes: 1 %
Lymphocytes Relative: 10 %
Lymphs Abs: 0.9 10*3/uL (ref 0.7–4.0)
MCH: 26.9 pg (ref 26.0–34.0)
MCHC: 32.2 g/dL (ref 30.0–36.0)
MCV: 83.3 fL (ref 80.0–100.0)
Monocytes Absolute: 0.4 10*3/uL (ref 0.1–1.0)
Monocytes Relative: 5 %
Neutro Abs: 7.4 10*3/uL (ref 1.7–7.7)
Neutrophils Relative %: 84 %
Platelets: 170 10*3/uL (ref 150–400)
RBC: 5.1 MIL/uL (ref 4.22–5.81)
RDW: 13.2 % (ref 11.5–15.5)
WBC: 8.8 10*3/uL (ref 4.0–10.5)
nRBC: 0 % (ref 0.0–0.2)

## 2020-09-03 LAB — GLUCOSE, CAPILLARY
Glucose-Capillary: 112 mg/dL — ABNORMAL HIGH (ref 70–99)
Glucose-Capillary: 100 mg/dL — ABNORMAL HIGH (ref 70–99)
Glucose-Capillary: 107 mg/dL — ABNORMAL HIGH (ref 70–99)
Glucose-Capillary: 117 mg/dL — ABNORMAL HIGH (ref 70–99)

## 2020-09-03 LAB — BRAIN NATRIURETIC PEPTIDE: B Natriuretic Peptide: 53.7 pg/mL (ref 0.0–100.0)

## 2020-09-03 LAB — MAGNESIUM: Magnesium: 2.2 mg/dL (ref 1.7–2.4)

## 2020-09-03 LAB — D-DIMER, QUANTITATIVE: D-Dimer, Quant: 1.77 ug/mL-FEU — ABNORMAL HIGH (ref 0.00–0.50)

## 2020-09-03 LAB — C-REACTIVE PROTEIN: CRP: 3.7 mg/dL — ABNORMAL HIGH (ref <1.0)

## 2020-09-03 MED ORDER — LIP MEDEX EX OINT
TOPICAL_OINTMENT | CUTANEOUS | Status: DC | PRN
  Filled 2020-09-03: qty 7

## 2020-09-03 NOTE — Progress Notes (Signed)
PROGRESS NOTE                                                                                                                                                                                                             Patient Demographics:    Todd [name], is a 54 y.o. male, DOB - 02/23/1966, IU:[id number]  Outpatient Primary MD for the patient is Seward Carol, MD    LOS - 4  Admit date - 08/30/2020    Chief Complaint  Patient presents with  . Shortness of Breath       Brief Narrative (HPI from H&P) Todd [name] is a 54 y.o. male with medical history significant of HTN, morbid obesity status post gastric bypass in 2018, arthritis, presented with new onset of shortness of breath.  Symptoms started 5 days before hospital visit, he was diagnosed with acute hypoxic respiratory failure due to COVID-19 pneumonia and admitted to the hospital   Subjective:   Patient seen and examined.  Reports dyspnea with activity, still complains of cough, denies nausea, vomiting or abdominal pain .   Assessment  & Plan :   Acute Hypoxic Resp. Failure due to Acute Covid 19 Viral Pneumonitis during the ongoing 2020 Covid 19 Pandemic  - he is unfortunately not vaccinated  - he  has incurred severe parenchymal lung injury due to COVID-19 pneumonia causing ARDS.  -  He has been placed on high-dose IV steroids. -Treated with Remdesivir  -He received Actemra . -Respiratory status remains very tenuous, he is on 15 L high flow nasal cannula at rest .  Encouraged the patient to sit up in chair in the daytime use I-S and flutter valve for pulmonary toiletry and then prone in bed when at night.  Will advance activity and titrate down oxygen as possible.  Actemra/Baricitinib  off label use - patient was told that if COVID-19 pneumonitis gets worse we might potentially use Actemra off label, patient denies any known history of active  diverticulitis, tuberculosis or hepatitis, understands the risks and benefits and wants to proceed with Actemra treatment if required.     SpO2: 90 % O2 Flow Rate (L/min): 15 L/min FiO2 (%): 100 %  Recent Labs  Lab 08/30/20 1312 08/30/20 1313 08/30/20 1820 08/30/20 1821 08/31/20 0409 08/31/20 0815 09/01/20 0103 09/02/20 0054 09/03/20 0051  WBC  --  9.1  --   --  9.5  --  5.1 8.8 8.8  HGB  --  14.2  --   --  13.5  --  14.0 13.9 13.7  HCT  --  43.5  --   --  40.7  --  43.1 42.0 42.5  PLT  --  96*  --   --  88*  --  128* 149* 170  CRP  --   --  21.0*  --  19.3*  --  19.2* 8.2* 3.7*  BNP  --   --   --   --   --  117.7* 80.0 86.2 53.7  DDIMER  --   --  1.17*  --  1.21*  --  1.71* 1.92* 1.77*  PROCALCITON  --   --  0.44  --   --  0.71 0.51 0.33  --   AST  --   --  48*  --  46*  --  42* 34 41  ALT  --   --  29  --  27  --  29 31 38  ALKPHOS  --   --  55  --  50  --  52 48 47  BILITOT  --   --  1.4*  --  1.1  --  1.1 1.0 0.9  ALBUMIN  --   --  3.0*  --  2.5*  --  2.4* 2.3* 2.3*  LATICACIDVEN  --   --   --  1.2  --   --   --   --   --   SARSCOV2NAA POSITIVE*  --   --   --   --   --   --   --   --       Severe dehydration.  Resolved.  HX of gastric bypass.  Supportive care.  GERD.  PPI.  Hypertension.   - Blood pressure well controlled.  Continue as needed hydralazine.   Hypoxia induced mild bump in troponin, chest pain-free, EKG nonacute.  Is on low-dose aspirin and echo shows normal ejection fraction, no WMA.  Epistaxis: Brief episode on 09/01/2020.  No further episodes.  Monitor closely.  Condition - Extremely Guarded  Family Communication  : Patient will update his family himself.  Code Status :  Full  Consults  :  None  Procedures  :    PUD Prophylaxis : PPI  Disposition Plan  : To be determined.  May need several more days in the hospital.  Status is: Inpatient  Remains inpatient appropriate because:IV treatments appropriate due to intensity of illness or  inability to take PO   Dispo: The patient is from: Home              Anticipated d/c is to: Home              Anticipated d/c date is: > 3 days              Patient currently is not medically stable to d/c.   DVT Prophylaxis  :  Lovenox    Lab Results  Component Value Date   PLT 170 09/03/2020    Diet :  Diet Order            Diet 2 gram sodium Room service appropriate? Yes; Fluid consistency: Thin  Diet effective now                  Inpatient Medications  Scheduled Meds: . vitamin C with  rose hips  1,000 mg Oral Daily  . aspirin  81 mg Oral Daily  . cholecalciferol  1,000 Units Oral Daily  . enoxaparin (LOVENOX) injection  60 mg Subcutaneous Q24H  . insulin aspart  0-15 Units Subcutaneous TID WC  . insulin aspart  0-5 Units Subcutaneous QHS  . Ipratropium-Albuterol  1 puff Inhalation Q6H  . methylPREDNISolone (SOLU-MEDROL) injection  60 mg Intravenous Q12H  . pantoprazole  40 mg Oral Daily  . zinc sulfate  220 mg Oral Daily   Continuous Infusions:  PRN Meds:.acetaminophen, guaiFENesin-dextromethorphan, hydrALAZINE, [DISCONTINUED] ondansetron **OR** ondansetron (ZOFRAN) IV, simethicone  Antibiotics  :    Anti-infectives (From admission, onward)   Start     Dose/Rate Route Frequency Ordered Stop   08/31/20 1000  remdesivir 100 mg in sodium chloride 0.9 % 100 mL IVPB       "Followed by" Linked Group Details   100 mg 200 mL/hr over 30 Minutes Intravenous Daily 08/30/20 1742 09/03/20 0951   08/30/20 1745  remdesivir 200 mg in sodium chloride 0.9% 250 mL IVPB       "Followed by" Linked Group Details   200 mg 580 mL/hr over 30 Minutes Intravenous Once 08/30/20 1742 08/30/20 2121       Emeline Gins Cohan Stipes M.D on 09/03/2020 at 12:39 PM  To page go to [email or web address]   [facility] -  Office  [phone]    See all Orders from today for further details    Objective:   Vitals:   09/02/20 2002 09/02/20 2034 09/03/20 0423 09/03/20 0837  BP: 110/65 (!)  156/83 135/74 104/65  Pulse: 64 (!) 57 (!) 50 77  Resp: 18 18 18    Temp: 97.8 F (36.6 C) 98 F (36.7 C) 98 F (36.7 C) 98.5 F (36.9 C)  TempSrc: Oral  Oral Oral  SpO2: 95% 96% 95% 90%  Weight:      Height:        Wt Readings from Last 3 Encounters:  08/31/20 119.7 kg  08/07/17 (!) 217.7 kg  04/03/16 (!) 217.7 kg     Intake/Output Summary (Last 24 hours) at 09/03/2020 1239 Last data filed at 09/03/2020 0835 Gross per 24 hour  Intake 240 ml  Output 300 ml  Net -60 ml     Physical Exam  Awake Alert, Oriented X 3, No new F.N deficits, Normal affect Symmetrical Chest wall movement, Good air movement bilaterally, CTAB RRR,No Gallops,Rubs or new Murmurs, No Parasternal Heave +ve B.Sounds, Abd Soft, No tenderness, No rebound - guarding or rigidity. No Cyanosis, Clubbing , +1 edema, No new Rash or bruise      Data Review:    CBC Recent Labs  Lab 08/30/20 1313 08/31/20 0409 09/01/20 0103 09/02/20 0054 09/03/20 0051  WBC 9.1 9.5 5.1 8.8 8.8  HGB 14.2 13.5 14.0 13.9 13.7  HCT 43.5 40.7 43.1 42.0 42.5  PLT 96* 88* 128* 149* 170  MCV 84.8 84.8 83.5 83.0 83.3  MCH 27.7 28.1 27.1 27.5 26.9  MCHC 32.6 33.2 32.5 33.1 32.2  RDW 13.8 13.7 13.4 13.6 13.2  LYMPHSABS  --  1.0 0.3* 1.2 0.9  MONOABS  --  0.4 0.2 0.4 0.4  EOSABS  --  0.0 0.0 0.0 0.0  BASOSABS  --  0.0 0.0 0.0 0.0    Recent Labs  Lab 08/30/20 1313 08/30/20 1820 08/30/20 1821 08/31/20 0409 08/31/20 0815 09/01/20 0103 09/02/20 0054 09/03/20 0051  NA 137  --   --  136  --  137 137  136  K 3.8  --   --  3.6  --  3.6 4.1 3.8  CL 99  --   --  100  --  100 100 99  CO2 27  --   --  25  --  28 29 26   GLUCOSE 130*  --   --  116*  --  137* 156* 161*  BUN 15  --   --  15  --  20 23* 27*  CREATININE 1.16  --   --  0.94  --  0.86 0.99 0.96  CALCIUM 8.7*  --   --  8.2*  --  8.5* 8.4* 8.1*  AST  --  48*  --  46*  --  42* 34 41  ALT  --  29  --  27  --  29 31 38  ALKPHOS  --  55  --  50  --  52 48 47   BILITOT  --  1.4*  --  1.1  --  1.1 1.0 0.9  ALBUMIN  --  3.0*  --  2.5*  --  2.4* 2.3* 2.3*  MG  --   --   --  2.1  --  2.2 2.3 2.2  CRP  --  21.0*  --  19.3*  --  19.2* 8.2* 3.7*  DDIMER  --  1.17*  --  1.21*  --  1.71* 1.92* 1.77*  PROCALCITON  --  0.44  --   --  0.71 0.51 0.33  --   LATICACIDVEN  --   --  1.2  --   --   --   --   --   HGBA1C  --   --   --   --  5.1  --   --   --   BNP  --   --   --   --  117.7* 80.0 86.2 53.7    ------------------------------------------------------------------------------------------------------------------ No results for input(s): CHOL, HDL, LDLCALC, TRIG, CHOLHDL, LDLDIRECT in the last 72 hours.  Lab Results  Component Value Date   HGBA1C 5.1 08/31/2020   ------------------------------------------------------------------------------------------------------------------ No results for input(s): TSH, T4TOTAL, T3FREE, THYROIDAB in the last 72 hours.  Invalid input(s): FREET3  Cardiac Enzymes No results for input(s): CKMB, TROPONINI, MYOGLOBIN in the last 168 hours.  Invalid input(s): CK ------------------------------------------------------------------------------------------------------------------    Component Value Date/Time   BNP 53.7 09/03/2020 0051    Micro Results Recent Results (from the past 240 hour(s))  Resp Panel by RT-PCR (Flu A&B, Covid) Nasopharyngeal Swab     Status: Abnormal   Collection Time: 08/30/20  1:12 PM   Specimen: Nasopharyngeal Swab; Nasopharyngeal(NP) swabs in vial transport medium  Result Value Ref Range Status   SARS Coronavirus 2 by RT PCR POSITIVE (A) NEGATIVE Final    Comment: emailed L. Berdik RN 14:20 08/30/20 ([email or web address]) (NOTE) SARS-CoV-2 target nucleic acids are DETECTED.  The SARS-CoV-2 RNA is generally detectable in upper respiratory specimens during the acute phase of infection. Positive results are indicative of the presence of the identified virus, but do not rule out bacterial infection or  co-infection with other pathogens not detected by the test. Clinical correlation with patient history and other diagnostic information is necessary to determine patient infection status. The expected result is Negative.  Fact Sheet for Patients: EntrepreneurPulse.com.au  Fact Sheet for Healthcare Providers: IncredibleEmployment.be  This test is not yet approved or cleared by the [place] FDA and  has been authorized for detection and/or diagnosis of SARS-CoV-2 by  FDA under an Emergency Use Authorization (EUA).  This EUA will remain in effect (meaning this test can be used) for the duration of  the COVID- 19 declaration under Section 564(b)(1) of the Act, 21 U.S.C. section 360bbb-3(b)(1), unless the authorization is terminated or revoked sooner.     Influenza A by PCR NEGATIVE NEGATIVE Final   Influenza B by PCR NEGATIVE NEGATIVE Final    Comment: (NOTE) The Xpert Xpress SARS-CoV-2/FLU/RSV plus assay is intended as an aid in the diagnosis of influenza from Nasopharyngeal swab specimens and should not be used as a sole basis for treatment. Nasal washings and aspirates are unacceptable for Xpert Xpress SARS-CoV-2/FLU/RSV testing.  Fact Sheet for Patients: BloggerCourse.com  Fact Sheet for Healthcare Providers: SeriousBroker.it  This test is not yet approved or cleared by the [place] FDA and has been authorized for detection and/or diagnosis of SARS-CoV-2 by FDA under an Emergency Use Authorization (EUA). This EUA will remain in effect (meaning this test can be used) for the duration of the COVID-19 declaration under Section 564(b)(1) of the Act, 21 U.S.C. section 360bbb-3(b)(1), unless the authorization is terminated or revoked.  Performed at [facility], [address], [place], [place] [address]   Blood Culture (routine x 2)     Status: None (Preliminary result)    Collection Time: 08/30/20  4:04 PM   Specimen: BLOOD RIGHT ARM  Result Value Ref Range Status   Specimen Description BLOOD RIGHT ARM  Final   Special Requests   Final    BOTTLES DRAWN AEROBIC AND ANAEROBIC Blood Culture adequate volume   Culture   Final    NO GROWTH 4 DAYS Performed at [facility], [address], [place], [place] [address]    Report Status PENDING  Incomplete  Blood Culture (routine x 2)     Status: None (Preliminary result)   Collection Time: 08/31/20  8:15 AM   Specimen: BLOOD LEFT WRIST  Result Value Ref Range Status   Specimen Description BLOOD LEFT WRIST  Final   Special Requests   Final    BOTTLES DRAWN AEROBIC AND ANAEROBIC Blood Culture results may not be optimal due to an inadequate volume of blood received in culture bottles   Culture   Final    NO GROWTH 3 DAYS Performed at [facility], [address], [place], [place] [address]    Report Status PENDING  Incomplete    Radiology Reports DG Chest Portable 1 View  Result Date: 08/30/2020 CLINICAL DATA:  54 year old male with hypoxia. Positive COVID-19. EXAM: PORTABLE CHEST 1 VIEW COMPARISON:  Chest radiograph dated 11/18/2010. FINDINGS: There is cardiomegaly with vascular congestion. Bilateral mid to lower lung field predominant hazy opacities consistent with multifocal pneumonia and in keeping with COVID-19. Clinical correlation is recommended. There is no pleural effusion pneumothorax. No acute osseous pathology. IMPRESSION: 1. Multifocal pneumonia. 2. Cardiomegaly with vascular congestion. Electronically Signed   By: Elgie Collard M.D.   On: 08/30/2020 16:09   ECHOCARDIOGRAM LIMITED  Result Date: 08/31/2020    ECHOCARDIOGRAM LIMITED REPORT   Patient Name:   ANTONIN MEININGER Date of Exam: 08/31/2020 Medical Rec #:  [id number]       Height:       72.0 in Accession #:    [id number]      Weight:       272.1 lb Date of Birth:  Oct 31, 1965       BSA:  2.428 m Patient Age:    28 years         BP:           125/72 mmHg Patient Gender: M               HR:           68 bpm. Exam Location:  Inpatient Procedure: Limited Echo, Cardiac Doppler and Color Doppler Indications:    CHF-Acute Diastolic [id number] / [id number]  History:        Patient has no prior history of Echocardiogram examinations.                 Risk Factors:Hypertension and Non-Smoker.  Sonographer:    Vickie Epley RDCS Referring Phys: [id number] Margaree Mackintosh [facility]  Sonographer Comments: Covid positive. IMPRESSIONS  1. Left ventricular ejection fraction, by estimation, is 55 to 60%. The left ventricle has normal function. The left ventricle has no regional wall motion abnormalities. Left ventricular diastolic parameters were normal.  2. Right ventricular systolic function is normal. The right ventricular size is normal. There is normal pulmonary artery systolic pressure.  3. The mitral valve is normal in structure. Trivial mitral valve regurgitation. No evidence of mitral stenosis.  4. The aortic valve is normal in structure. Aortic valve regurgitation is not visualized. No aortic stenosis is present.  5. The inferior vena cava is normal in size with greater than 50% respiratory variability, suggesting right atrial pressure of 3 mmHg. FINDINGS  Left Ventricle: Left ventricular ejection fraction, by estimation, is 55 to 60%. The left ventricle has normal function. The left ventricle has no regional wall motion abnormalities. The left ventricular internal cavity size was normal in size. There is  borderline concentric left ventricular hypertrophy. Left ventricular diastolic parameters were normal. Normal left ventricular filling pressure. Right Ventricle: The right ventricular size is normal. No increase in right ventricular wall thickness. Right ventricular systolic function is normal. There is normal pulmonary artery systolic pressure. The tricuspid regurgitant velocity is 1.89 m/s, and  with an assumed right atrial pressure of 3 mmHg, the estimated right  ventricular systolic pressure is [id number] mmHg. Left Atrium: Left atrial size was normal in size. Right Atrium: Right atrial size was normal in size. Pericardium: There is no evidence of pericardial effusion. Mitral Valve: The mitral valve is normal in structure. Trivial mitral valve regurgitation. No evidence of mitral valve stenosis. Tricuspid Valve: The tricuspid valve is normal in structure. Tricuspid valve regurgitation is not demonstrated. No evidence of tricuspid stenosis. Aortic Valve: The aortic valve is normal in structure. Aortic valve regurgitation is not visualized. No aortic stenosis is present. Pulmonic Valve: The pulmonic valve was normal in structure. Pulmonic valve regurgitation is not visualized. No evidence of pulmonic stenosis. Aorta: The aortic root is normal in size and structure. Venous: The inferior vena cava is normal in size with greater than 50% respiratory variability, suggesting right atrial pressure of 3 mmHg. IAS/Shunts: No atrial level shunt detected by color flow Doppler. LEFT VENTRICLE PLAX 2D LVIDd:         5.28 cm      Diastology LVIDs:         3.70 cm      LV e' medial:    6.41 cm/s LV PW:         1.10 cm      LV E/e' medial:  6.5 LV IVS:        1.10 cm      LV e' lateral:  8.51 cm/s LVOT diam:     2.40 cm      LV E/e' lateral: 4.9 LV SV:         79 LV SV Index:   33 LVOT Area:     4.52 cm  LV Volumes (MOD) LV vol d, MOD A2C: 197.0 ml LV vol d, MOD A4C: 232.0 ml LV vol s, MOD A2C: 81.4 ml LV vol s, MOD A4C: 111.0 ml LV SV MOD A2C:     115.6 ml LV SV MOD A4C:     232.0 ml LV SV MOD BP:      120.7 ml RIGHT VENTRICLE RV S prime:     15.50 cm/s TAPSE (M-mode): 3.2 cm LEFT ATRIUM         Index LA diam:    3.60 cm 1.48 cm/m  AORTIC VALVE LVOT Vmax:   91.20 cm/s LVOT Vmean:  65.900 cm/s LVOT VTI:    0.175 m  AORTA Ao Root diam: 3.60 cm MITRAL VALVE               TRICUSPID VALVE MV Area (PHT): 2.99 cm    TR Peak grad:   14.3 mmHg MV Decel Time: 254 msec    TR Vmax:        189.00 cm/s MV  E velocity: 41.90 cm/s MV A velocity: 36.10 cm/s  SHUNTS MV E/A ratio:  1.16        Systemic VTI:  0.18 m                            Systemic Diam: 2.40 cm Dani Gobble Croitoru MD Electronically signed by Sanda Klein MD Signature Date/Time: 08/31/2020/3:07:43 PM    Final

## 2020-09-03 NOTE — Evaluation (Signed)
Occupational Therapy Evaluation Patient Details Name: Todd [name] MRN: [id number] DOB: 07-18-1966 Today's Date: 09/03/2020    History of Present Illness 54 y.o. male with medical history significant of HTN, morbid obesity status post gastric bypass in 2018, arthritis, presented with new onset of shortness of breath.  COVID + (unvaccinated); dehydration   Clinical Impression   PTA pt living with family and functioning at independent community level. 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 At time of eval, pt on 15L HFNC. He was able to mobilize from recliner to sink in anticipation for ADL. Pt was able to complete grooming task at sink with set up assist, and able to stand for 25% of task prior to requiring seated rest break. He then was able to walk two laps in room on 15L [place] with VSS with spot check from dynamap. Continuous O2 monitoring showing poor pleth despite trying both ears, various fingers and differing cords. Anticipate pt will progress well without follow up. Will continue to follow while acutely admitted to facilitate safe d/c.     Follow Up Recommendations  No OT follow up    Equipment Recommendations  Tub/shower seat    Recommendations for Other Services       Precautions / Restrictions Precautions Precautions: Fall Restrictions Weight Bearing Restrictions: No      Mobility Bed Mobility Overal bed mobility: Modified Independent                  Transfers Overall transfer level: Needs assistance Equipment used: None Transfers: Sit to/from Stand Sit to Stand: Supervision              Balance Overall balance assessment: Mild deficits observed, not formally tested                                         ADL either performed or assessed with clinical judgement   ADL Overall ADL's : Needs assistance/impaired     Grooming: Set up;Sitting;Standing Grooming Details (indicate cue  type and reason): able to stand for 25% of grooming activities due to poor activity tolerance             Lower Body Dressing: Set up;Sitting/lateral leans;Sit to/from stand Lower Body Dressing Details (indicate cue type and reason): donned sweat pants and shoes without external assist, only set up for supplies Toilet Transfer: Supervision/safety;Ambulation;Regular Toilet           Functional mobility during ADLs: Supervision/safety General ADL Comments: overall supervision-set up for ADLs. requires increased time and cues to implement appropriate energy conservation skills     Vision Patient Visual Report: No change from baseline       Perception     Praxis      Pertinent Vitals/Pain Pain Assessment: No/denies pain     Hand Dominance     Extremity/Trunk Assessment Upper Extremity Assessment Upper Extremity Assessment: Overall WFL for tasks assessed   Lower Extremity Assessment Lower Extremity Assessment: Defer to PT evaluation       Communication Communication Communication: No difficulties   Cognition Arousal/Alertness: Awake/alert Behavior During Therapy: WFL for tasks assessed/performed Overall Cognitive Status: Within Functional Limits for tasks assessed  General Comments       Exercises     Shoulder Instructions      Home Living Family/patient expects to be discharged to:: Private residence Living Arrangements: Spouse/significant other;Children Available Help at Discharge: Family Type of Home: House Home Access: Level entry     Home Layout: Two level;Able to live on main level with bedroom/bathroom     Bathroom Shower/Tub: Producer, television/film/video: Handicapped height     Home Equipment: Cane - single point          Prior Functioning/Environment Level of Independence: Independent        Comments: chemical processor; 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        OT Problem List: Decreased  knowledge of use of DME or AE;Decreased knowledge of precautions;Decreased activity tolerance;Cardiopulmonary status limiting activity      OT Treatment/Interventions: Self-care/ADL training;Therapeutic exercise;Patient/family education;Balance training;Energy conservation;Therapeutic activities;DME and/or AE instruction    OT Goals(Current goals can be found in the care plan section) Acute Rehab OT Goals Patient Stated Goal: return home ASAP OT Goal Formulation: With patient Time For Goal Achievement: 09/17/20 Potential to Achieve Goals: Good  OT Frequency: Min 3X/week   Barriers to D/C:            Co-evaluation              AM-PAC OT "6 Clicks" Daily Activity     Outcome Measure Help from another person eating meals?: None Help from another person taking care of personal grooming?: None Help from another person toileting, which includes using toliet, bedpan, or urinal?: A Little Help from another person bathing (including washing, rinsing, drying)?: A Little Help from another person to put on and taking off regular upper body clothing?: None Help from another person to put on and taking off regular lower body clothing?: A Little 6 Click Score: 21   End of Session Nurse Communication: Mobility status  Activity Tolerance: Patient tolerated treatment well Patient left: in chair;with call bell/phone within reach  OT Visit Diagnosis: Other abnormalities of gait and mobility (R26.89)                Time: 0223-3612 OT Time Calculation (min): 44 min Charges:  OT General Charges $OT Visit: 1 Visit OT Evaluation $OT Eval Moderate Complexity: 1 Mod OT Treatments $Self Care/Home Management : 23-37 mins  Dalphine Handing, MSOT, OTR/L Acute Rehabilitation Services [facility] Office Number: [phone] Pager: [phone]  Dalphine Handing 09/03/2020, 4:51 PM

## 2020-09-03 NOTE — Progress Notes (Signed)
Physical Therapy Treatment Patient Details Name: Todd [name] MRN: [id number] DOB: 1965/09/25 Today's Date: 09/03/2020    History of Present Illness 54 y.o. male with medical history significant of HTN, morbid obesity status post gastric bypass in 2018, arthritis, presented with new onset of shortness of breath.  COVID + (unvaccinated); dehydration    PT Comments    Pt slowly with activity tolerance. Pt on 15L HFNC with resting SpO2 93%. With amb erratic SpO2 reading. At end of amb SpO2 seemed to be 82%. Pt with some dyspnea but no distress and talking. After 90 sec seated SpO2 back up to 90%. Pt motivated to return to prior level of function.    Follow Up Recommendations  No PT follow up     Equipment Recommendations  None recommended by PT    Recommendations for Other Services       Precautions / Restrictions Precautions Precautions: Fall Precaution Comments: 15L HFNC Restrictions Weight Bearing Restrictions: No    Mobility  Bed Mobility Overal bed mobility: Modified Independent             General bed mobility comments: Pt up in chair  Transfers Overall transfer level: Modified independent Equipment used: None Transfers: Sit to/from Stand Sit to Stand: Modified independent (Device/Increase time)            Ambulation/Gait Ambulation/Gait assistance: Supervision Gait Distance (Feet): 120 Feet (x 2) Assistive device: None Gait Pattern/deviations: Step-through pattern;Wide base of support Gait velocity: decr due to pulmonary limitations Gait velocity interpretation: 1.31 - 2.62 ft/sec, indicative of limited community ambulator General Gait Details: supervision for safety and to monitor vitals   Stairs             Wheelchair Mobility    Modified Rankin (Stroke Patients Only)       Balance Overall balance assessment: Mild deficits observed, not formally tested                                          Cognition  Arousal/Alertness: Awake/alert Behavior During Therapy: WFL for tasks assessed/performed Overall Cognitive Status: Within Functional Limits for tasks assessed                                        Exercises      General Comments General comments (skin integrity, edema, etc.): Pt on 15L HFNC with resting SpO2 93%. With amb erratic SpO2 reading. At end of amb SpO2 seemed to be 82%. Pt with some dyspnea but no distress and talking. After 90 sec seated SpO2 back up to 90%.      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   PT Goals (current goals can now be found in the care plan section) Acute Rehab PT Goals Patient Stated Goal: return home ASAP PT Goal Formulation: With patient Time For Goal Achievement: 09/15/20 Potential to Achieve Goals: Good Progress towards PT  goals: Progressing toward goals    Frequency    Min 3X/week      PT Plan Current plan remains appropriate    Co-evaluation              AM-PAC PT "6 Clicks" Mobility   Outcome Measure  Help needed turning from your back to your side while in a flat bed without using bedrails?: None Help needed moving from lying on your back to sitting on the side of a flat bed without using bedrails?: None Help needed moving to and from a bed to a chair (including a wheelchair)?: A Little Help needed standing up from a chair using your arms (e.g., wheelchair or bedside chair)?: None Help needed to walk in hospital room?: A Little Help needed climbing 3-5 steps  with a railing? : Total 6 Click Score: 19    End of Session Equipment Utilized During Treatment: Oxygen Activity Tolerance: Patient tolerated treatment well Patient left: in chair;with call bell/phone within reach;Other (comment) (chair alarm pad under him; alarm in room not working; NT made aware) Nurse Communication: Mobility status PT Visit Diagnosis: Difficulty in walking, not elsewhere classified (R26.2)     Time: [id number] PT Time Calculation (min) (ACUTE ONLY): 22 min  Charges:  $Gait Training: 8-22 mins                     [place] Pager [phone] Office [facility] 09/03/2020, 6:07 PM

## 2020-09-03 NOTE — Plan of Care (Signed)
  Problem: Education: Goal: Knowledge of risk factors and measures for prevention of condition will improve Outcome: Progressing   

## 2020-09-04 LAB — COMPREHENSIVE METABOLIC PANEL
ALT: 68 U/L — ABNORMAL HIGH (ref 0–44)
AST: 53 U/L — ABNORMAL HIGH (ref 15–41)
Albumin: 2.4 g/dL — ABNORMAL LOW (ref 3.5–5.0)
Alkaline Phosphatase: 46 U/L (ref 38–126)
Anion gap: 7 (ref 5–15)
BUN: 27 mg/dL — ABNORMAL HIGH (ref 6–20)
CO2: 31 mmol/L (ref 22–32)
Calcium: 8.3 mg/dL — ABNORMAL LOW (ref 8.9–10.3)
Chloride: 98 mmol/L (ref 98–111)
Creatinine, Ser: 1.02 mg/dL (ref 0.61–1.24)
GFR, Estimated: >60 mL/min (ref >60)
Glucose, Bld: 132 mg/dL — ABNORMAL HIGH (ref 70–99)
Potassium: 4.3 mmol/L (ref 3.5–5.1)
Sodium: 136 mmol/L (ref 135–145)
Total Bilirubin: 1.1 mg/dL (ref 0.3–1.2)
Total Protein: 5.5 g/dL — ABNORMAL LOW (ref 6.5–8.1)

## 2020-09-04 LAB — BRAIN NATRIURETIC PEPTIDE: B Natriuretic Peptide: 39.9 pg/mL (ref 0.0–100.0)

## 2020-09-04 LAB — CBC WITH DIFFERENTIAL/PLATELET
Abs Immature Granulocytes: 0.08 10*3/uL — ABNORMAL HIGH (ref 0.00–0.07)
Basophils Absolute: 0 10*3/uL (ref 0.0–0.1)
Basophils Relative: 0 %
Eosinophils Absolute: 0 10*3/uL (ref 0.0–0.5)
Eosinophils Relative: 0 %
HCT: 43.4 % (ref 39.0–52.0)
Hemoglobin: 13.9 g/dL (ref 13.0–17.0)
Immature Granulocytes: 1 %
Lymphocytes Relative: 9 %
Lymphs Abs: 0.7 10*3/uL (ref 0.7–4.0)
MCH: 26.8 pg (ref 26.0–34.0)
MCHC: 32 g/dL (ref 30.0–36.0)
MCV: 83.6 fL (ref 80.0–100.0)
Monocytes Absolute: 0.4 10*3/uL (ref 0.1–1.0)
Monocytes Relative: 6 %
Neutro Abs: 6.6 10*3/uL (ref 1.7–7.7)
Neutrophils Relative %: 84 %
Platelets: 188 10*3/uL (ref 150–400)
RBC: 5.19 MIL/uL (ref 4.22–5.81)
RDW: 13.3 % (ref 11.5–15.5)
WBC: 7.9 10*3/uL (ref 4.0–10.5)
nRBC: 0 % (ref 0.0–0.2)

## 2020-09-04 LAB — GLUCOSE, CAPILLARY
Glucose-Capillary: 114 mg/dL — ABNORMAL HIGH (ref 70–99)
Glucose-Capillary: 130 mg/dL — ABNORMAL HIGH (ref 70–99)
Glucose-Capillary: 86 mg/dL (ref 70–99)
Glucose-Capillary: 108 mg/dL — ABNORMAL HIGH (ref 70–99)

## 2020-09-04 LAB — CULTURE, BLOOD (ROUTINE X 2)
Culture: NO GROWTH
Special Requests: ADEQUATE

## 2020-09-04 LAB — MAGNESIUM: Magnesium: 2.3 mg/dL (ref 1.7–2.4)

## 2020-09-04 LAB — D-DIMER, QUANTITATIVE: D-Dimer, Quant: 2.4 ug/mL-FEU — ABNORMAL HIGH (ref 0.00–0.50)

## 2020-09-04 LAB — C-REACTIVE PROTEIN: CRP: 1.8 mg/dL — ABNORMAL HIGH (ref <1.0)

## 2020-09-04 MED ORDER — SALINE SPRAY 0.65 % NA SOLN
1.0000 | NASAL | Status: DC | PRN
  Administered 2020-09-06: 1 via NASAL
  Filled 2020-09-04: qty 44

## 2020-09-04 NOTE — Progress Notes (Signed)
PROGRESS NOTE                                                                                                                                                                                                             Patient Demographics:    Todd [name], is a 55 y.o. male, DOB - 19-Feb-1966, [id number]  Outpatient Primary MD for the patient is Seward Carol, MD    LOS - 5  Admit date - 08/30/2020    Chief Complaint  Patient presents with  . Shortness of Breath       Brief Narrative (HPI from H&P) Todd [name] is a 55 y.o. male with medical history significant of HTN, morbid obesity status post gastric bypass in 2018, arthritis, presented with new onset of shortness of breath.  Symptoms started 5 days before hospital visit, he was diagnosed with acute hypoxic respiratory failure due to COVID-19 pneumonia and admitted to the hospital   Subjective:   Patient seen and examined.  He still reports dyspnea on cough, but mainly with activity, he does report some nasal congestion.     Assessment  & Plan :   Acute Hypoxic Resp. Failure due to Acute Covid 19 Viral Pneumonitis during the ongoing 2020 Covid 19 Pandemic  - he is unfortunately not vaccinated  - he  has incurred severe parenchymal lung injury due to COVID-19 pneumonia causing ARDS.  -  He has been placed on high-dose IV steroids. -Treated with Remdesivir  -He received Actemra . -Respiratory status remains very tenuous, he is on 12 L high flow nasal cannula at rest .  There 35 L heated high flow nasal cannula initially. -Continue to trend inflammatory markers, D-dimers is 2.4, will obtain venous Dopplers, continue with DVT prophylaxis at current dose  Encouraged the patient to sit up in chair in the daytime use I-S and flutter valve for pulmonary toiletry and then prone in bed when at night.  Will advance activity and titrate down oxygen as  possible.  Actemra/Baricitinib  off label use - patient was told that if COVID-19 pneumonitis gets worse we might potentially use Actemra off label, patient denies any known history of active diverticulitis, tuberculosis or hepatitis, understands the risks and benefits and wants to proceed with Actemra treatment if required.     SpO2: 91 % O2 Flow Rate (L/min):  12 L/min FiO2 (%): 100 %  Recent Labs  Lab 08/30/20 1312 08/30/20 1313 08/30/20 1820 08/30/20 1821 08/31/20 0409 08/31/20 0815 09/01/20 0103 09/02/20 0054 09/03/20 0051 09/04/20 0341  WBC  --    < >  --   --  9.5  --  5.1 8.8 8.8 7.9  HGB  --    < >  --   --  13.5  --  14.0 13.9 13.7 13.9  HCT  --    < >  --   --  40.7  --  43.1 42.0 42.5 43.4  PLT  --    < >  --   --  88*  --  128* 149* 170 188  CRP  --    < > 21.0*  --  19.3*  --  19.2* 8.2* 3.7* 1.8*  BNP  --   --   --   --   --  117.7* 80.0 86.2 53.7 39.9  DDIMER  --    < > 1.17*  --  1.21*  --  1.71* 1.92* 1.77* 2.40*  PROCALCITON  --   --  0.44  --   --  0.71 0.51 0.33  --   --   AST  --    < > 48*  --  46*  --  42* 34 41 53*  ALT  --    < > 29  --  27  --  29 31 38 68*  ALKPHOS  --    < > 55  --  50  --  52 48 47 46  BILITOT  --    < > 1.4*  --  1.1  --  1.1 1.0 0.9 1.1  ALBUMIN  --    < > 3.0*  --  2.5*  --  2.4* 2.3* 2.3* 2.4*  LATICACIDVEN  --   --   --  1.2  --   --   --   --   --   --   SARSCOV2NAA POSITIVE*  --   --   --   --   --   --   --   --   --    < > = values in this interval not displayed.      Severe dehydration.  Resolved.  HX of gastric bypass.  Supportive care.  GERD.  PPI.  Hypertension.   - Blood pressure well controlled.  Continue as needed hydralazine.   Hypoxia induced mild bump in troponin, chest pain-free, EKG nonacute.  Is on low-dose aspirin and echo shows normal ejection fraction, no WMA.  Epistaxis: Brief episode on 09/01/2020.  No further episodes.  Monitor closely.  Condition - Extremely Guarded  Family Communication   : Patient will update his family himself.  Code Status :  Full  Consults  :  None  Procedures  :    PUD Prophylaxis : PPI  Disposition Plan  : To be determined.  May need several more days in the hospital.  Status is: Inpatient  Remains inpatient appropriate because:IV treatments appropriate due to intensity of illness or inability to take PO   Dispo: The patient is from: Home              Anticipated d/c is to: Home              Anticipated d/c date is: > 3 days              Patient currently is  not medically stable to d/c.   DVT Prophylaxis  :  Lovenox    Lab Results  Component Value Date   PLT 188 09/04/2020    Diet :  Diet Order            Diet 2 gram sodium Room service appropriate? Yes; Fluid consistency: Thin  Diet effective now                  Inpatient Medications  Scheduled Meds: . vitamin C with rose hips  1,000 mg Oral Daily  . aspirin  81 mg Oral Daily  . cholecalciferol  1,000 Units Oral Daily  . enoxaparin (LOVENOX) injection  60 mg Subcutaneous Q24H  . insulin aspart  0-15 Units Subcutaneous TID WC  . insulin aspart  0-5 Units Subcutaneous QHS  . Ipratropium-Albuterol  1 puff Inhalation Q6H  . methylPREDNISolone (SOLU-MEDROL) injection  60 mg Intravenous Q12H  . pantoprazole  40 mg Oral Daily  . zinc sulfate  220 mg Oral Daily   Continuous Infusions:  PRN Meds:.acetaminophen, guaiFENesin-dextromethorphan, hydrALAZINE, lip balm, [DISCONTINUED] ondansetron **OR** ondansetron (ZOFRAN) IV, simethicone, sodium chloride  Antibiotics  :    Anti-infectives (From admission, onward)   Start     Dose/Rate Route Frequency Ordered Stop   08/31/20 1000  remdesivir 100 mg in sodium chloride 0.9 % 100 mL IVPB       "Followed by" Linked Group Details   100 mg 200 mL/hr over 30 Minutes Intravenous Daily 08/30/20 1742 09/03/20 1030   08/30/20 1745  remdesivir 200 mg in sodium chloride 0.9% 250 mL IVPB       "Followed by" Linked Group Details   200  mg 580 mL/hr over 30 Minutes Intravenous Once 08/30/20 1742 08/30/20 2121       Mliss Fritz Jaylianna Tatlock M.D on 09/04/2020 at 12:03 PM  To page go to [email or web address]   [facility] -  Office  [phone]    See all Orders from today for further details    Objective:   Vitals:   09/03/20 1634 09/03/20 2004 09/03/20 2110 09/04/20 0440  BP: 114/77 117/70  129/74  Pulse: 70 67 (!) 55 60  Resp:  20 (!) 22 (!) 23  Temp: 98.1 F (36.7 C) 97.9 F (36.6 C)  98 F (36.7 C)  TempSrc: Oral Oral  Oral  SpO2: 100% 97% 99% 91%  Weight:      Height:        Wt Readings from Last 3 Encounters:  08/31/20 119.7 kg  08/07/17 (!) 217.7 kg  04/03/16 (!) 217.7 kg     Intake/Output Summary (Last 24 hours) at 09/04/2020 1203 Last data filed at 09/04/2020 0600 Gross per 24 hour  Intake 360 ml  Output 780 ml  Net -420 ml     Physical Exam  Awake Alert, Oriented X 3, frail, No new F.N deficits, Normal affect Symmetrical Chest wall movement, Good air movement bilaterally, CTAB RRR,No Gallops,Rubs or new Murmurs, No Parasternal Heave +ve B.Sounds, Abd Soft, No tenderness, No rebound - guarding or rigidity. No Cyanosis, Clubbing , +1  edema, No new Rash or bruise      Data Review:    CBC Recent Labs  Lab 08/31/20 0409 09/01/20 0103 09/02/20 0054 09/03/20 0051 09/04/20 0341  WBC 9.5 5.1 8.8 8.8 7.9  HGB 13.5 14.0 13.9 13.7 13.9  HCT 40.7 43.1 42.0 42.5 43.4  PLT 88* 128* 149* 170 188  MCV 84.8 83.5 83.0 83.3 83.6  MCH 28.1 27.1 27.5  26.9 26.8  MCHC 33.2 32.5 33.1 32.2 32.0  RDW 13.7 13.4 13.6 13.2 13.3  LYMPHSABS 1.0 0.3* 1.2 0.9 0.7  MONOABS 0.4 0.2 0.4 0.4 0.4  EOSABS 0.0 0.0 0.0 0.0 0.0  BASOSABS 0.0 0.0 0.0 0.0 0.0    Recent Labs  Lab 08/30/20 1820 08/30/20 1821 08/31/20 0409 08/31/20 0815 09/01/20 0103 09/02/20 0054 09/03/20 0051 09/04/20 0341  NA  --   --  136  --  137 137 136 136  K  --   --  3.6  --  3.6 4.1 3.8 4.3  CL  --   --  100  --  100 100 99 98   CO2  --   --  25  --  28 29 26 31   GLUCOSE  --   --  116*  --  137* 156* 161* 132*  BUN  --   --  15  --  20 23* 27* 27*  CREATININE  --   --  0.94  --  0.86 0.99 0.96 1.02  CALCIUM  --   --  8.2*  --  8.5* 8.4* 8.1* 8.3*  AST 48*  --  46*  --  42* 34 41 53*  ALT 29  --  27  --  29 31 38 68*  ALKPHOS 55  --  50  --  52 48 47 46  BILITOT 1.4*  --  1.1  --  1.1 1.0 0.9 1.1  ALBUMIN 3.0*  --  2.5*  --  2.4* 2.3* 2.3* 2.4*  MG  --   --  2.1  --  2.2 2.3 2.2 2.3  CRP 21.0*  --  19.3*  --  19.2* 8.2* 3.7* 1.8*  DDIMER 1.17*  --  1.21*  --  1.71* 1.92* 1.77* 2.40*  PROCALCITON 0.44  --   --  0.71 0.51 0.33  --   --   LATICACIDVEN  --  1.2  --   --   --   --   --   --   HGBA1C  --   --   --  5.1  --   --   --   --   BNP  --   --   --  117.7* 80.0 86.2 53.7 39.9    ------------------------------------------------------------------------------------------------------------------ No results for input(s): CHOL, HDL, LDLCALC, TRIG, CHOLHDL, LDLDIRECT in the last 72 hours.  Lab Results  Component Value Date   HGBA1C 5.1 08/31/2020   ------------------------------------------------------------------------------------------------------------------ No results for input(s): TSH, T4TOTAL, T3FREE, THYROIDAB in the last 72 hours.  Invalid input(s): FREET3  Cardiac Enzymes No results for input(s): CKMB, TROPONINI, MYOGLOBIN in the last 168 hours.  Invalid input(s): CK ------------------------------------------------------------------------------------------------------------------    Component Value Date/Time   BNP 39.9 09/04/2020 0341    Micro Results Recent Results (from the past 240 hour(s))  Resp Panel by RT-PCR (Flu A&B, Covid) Nasopharyngeal Swab     Status: Abnormal   Collection Time: 08/30/20  1:12 PM   Specimen: Nasopharyngeal Swab; Nasopharyngeal(NP) swabs in vial transport medium  Result Value Ref Range Status   SARS Coronavirus 2 by RT PCR POSITIVE (A) NEGATIVE Final     Comment: emailed L. Berdik RN 14:20 08/30/20 ([email or web address]) (NOTE) SARS-CoV-2 target nucleic acids are DETECTED.  The SARS-CoV-2 RNA is generally detectable in upper respiratory specimens during the acute phase of infection. Positive results are indicative of the presence of the identified virus, but do not rule out bacterial infection or co-infection with other pathogens not  detected by the test. Clinical correlation with patient history and other diagnostic information is necessary to determine patient infection status. The expected result is Negative.  Fact Sheet for Patients: EntrepreneurPulse.com.au  Fact Sheet for Healthcare Providers: IncredibleEmployment.be  This test is not yet approved or cleared by the [place] FDA and  has been authorized for detection and/or diagnosis of SARS-CoV-2 by FDA under an Emergency Use Authorization (EUA).  This EUA will remain in effect (meaning this test can be used) for the duration of  the COVID- 19 declaration under Section 564(b)(1) of the Act, 21 U.S.C. section 360bbb-3(b)(1), unless the authorization is terminated or revoked sooner.     Influenza A by PCR NEGATIVE NEGATIVE Final   Influenza B by PCR NEGATIVE NEGATIVE Final    Comment: (NOTE) The Xpert Xpress SARS-CoV-2/FLU/RSV plus assay is intended as an aid in the diagnosis of influenza from Nasopharyngeal swab specimens and should not be used as a sole basis for treatment. Nasal washings and aspirates are unacceptable for Xpert Xpress SARS-CoV-2/FLU/RSV testing.  Fact Sheet for Patients: EntrepreneurPulse.com.au  Fact Sheet for Healthcare Providers: IncredibleEmployment.be  This test is not yet approved or cleared by the [place] FDA and has been authorized for detection and/or diagnosis of SARS-CoV-2 by FDA under an Emergency Use Authorization (EUA). This EUA will remain in effect (meaning this  test can be used) for the duration of the COVID-19 declaration under Section 564(b)(1) of the Act, 21 U.S.C. section 360bbb-3(b)(1), unless the authorization is terminated or revoked.  Performed at [facility], [place] [address], [place], [place] [address]   Blood Culture (routine x 2)     Status: None   Collection Time: 08/30/20  4:04 PM   Specimen: BLOOD RIGHT ARM  Result Value Ref Range Status   Specimen Description BLOOD RIGHT ARM  Final   Special Requests   Final    BOTTLES DRAWN AEROBIC AND ANAEROBIC Blood Culture adequate volume   Culture   Final    NO GROWTH 5 DAYS Performed at [facility], [place] [address] [place], [place] [address]    Report Status 09/04/2020 FINAL  Final  Blood Culture (routine x 2)     Status: None (Preliminary result)   Collection Time: 08/31/20  8:15 AM   Specimen: BLOOD LEFT WRIST  Result Value Ref Range Status   Specimen Description BLOOD LEFT WRIST  Final   Special Requests   Final    BOTTLES DRAWN AEROBIC AND ANAEROBIC Blood Culture results may not be optimal due to an inadequate volume of blood received in culture bottles   Culture   Final    NO GROWTH 4 DAYS Performed at [facility], [place] [address], [place], [place] [address]    Report Status PENDING  Incomplete    Radiology Reports DG Chest Portable 1 View  Result Date: 08/30/2020 CLINICAL DATA:  55 year old male with hypoxia. Positive COVID-19. EXAM: PORTABLE CHEST 1 VIEW COMPARISON:  Chest radiograph dated 11/18/2010. FINDINGS: There is cardiomegaly with vascular congestion. Bilateral mid to lower lung field predominant hazy opacities consistent with multifocal pneumonia and in keeping with COVID-19. Clinical correlation is recommended. There is no pleural effusion pneumothorax. No acute osseous pathology. IMPRESSION: 1. Multifocal pneumonia. 2. Cardiomegaly with vascular congestion. Electronically Signed   By: Anner Crete M.D.   On: 08/30/2020 16:09    ECHOCARDIOGRAM LIMITED  Result Date: 08/31/2020    ECHOCARDIOGRAM LIMITED REPORT   Patient Name:   AAVION JELKS  Date of Exam: 08/31/2020 Medical Rec #:  [id number]       Height:       72.0 in Accession #:    [id number]      Weight:       272.1 lb Date of Birth:  February 05, 1966       BSA:          2.428 m Patient Age:    67 years        BP:           125/72 mmHg Patient Gender: M               HR:           68 bpm. Exam Location:  Inpatient Procedure: Limited Echo, Cardiac Doppler and Color Doppler Indications:    CHF-Acute Diastolic [id number] / [id number]  History:        Patient has no prior history of Echocardiogram examinations.                 Risk Factors:Hypertension and Non-Smoker.  Sonographer:    Vickie Epley RDCS Referring Phys: [id number] Margaree Mackintosh [facility]  Sonographer Comments: Covid positive. IMPRESSIONS  1. Left ventricular ejection fraction, by estimation, is 55 to 60%. The left ventricle has normal function. The left ventricle has no regional wall motion abnormalities. Left ventricular diastolic parameters were normal.  2. Right ventricular systolic function is normal. The right ventricular size is normal. There is normal pulmonary artery systolic pressure.  3. The mitral valve is normal in structure. Trivial mitral valve regurgitation. No evidence of mitral stenosis.  4. The aortic valve is normal in structure. Aortic valve regurgitation is not visualized. No aortic stenosis is present.  5. The inferior vena cava is normal in size with greater than 50% respiratory variability, suggesting right atrial pressure of 3 mmHg. FINDINGS  Left Ventricle: Left ventricular ejection fraction, by estimation, is 55 to 60%. The left ventricle has normal function. The left ventricle has no regional wall motion abnormalities. The left ventricular internal cavity size was normal in size. There is  borderline concentric left ventricular hypertrophy. Left ventricular diastolic parameters were normal. Normal left ventricular  filling pressure. Right Ventricle: The right ventricular size is normal. No increase in right ventricular wall thickness. Right ventricular systolic function is normal. There is normal pulmonary artery systolic pressure. The tricuspid regurgitant velocity is 1.89 m/s, and  with an assumed right atrial pressure of 3 mmHg, the estimated right ventricular systolic pressure is [id number]. Left Atrium: Left atrial size was normal in size. Right Atrium: Right atrial size was normal in size. Pericardium: There is no evidence of pericardial effusion. Mitral Valve: The mitral valve is normal in structure. Trivial mitral valve regurgitation. No evidence of mitral valve stenosis. Tricuspid Valve: The tricuspid valve is normal in structure. Tricuspid valve regurgitation is not demonstrated. No evidence of tricuspid stenosis. Aortic Valve: The aortic valve is normal in structure. Aortic valve regurgitation is not visualized. No aortic stenosis is present. Pulmonic Valve: The pulmonic valve was normal in structure. Pulmonic valve regurgitation is not visualized. No evidence of pulmonic stenosis. Aorta: The aortic root is normal in size and structure. Venous: The inferior vena cava is normal in size with greater than 50% respiratory variability, suggesting right atrial pressure of 3 mmHg. IAS/Shunts: No atrial level shunt detected by color flow Doppler. LEFT VENTRICLE PLAX 2D LVIDd:         5.28 cm      Diastology LVIDs:  3.70 cm      LV e' medial:    6.41 cm/s LV PW:         1.10 cm      LV E/e' medial:  6.5 LV IVS:        1.10 cm      LV e' lateral:   8.51 cm/s LVOT diam:     2.40 cm      LV E/e' lateral: 4.9 LV SV:         79 LV SV Index:   33 LVOT Area:     4.52 cm  LV Volumes (MOD) LV vol d, MOD A2C: 197.0 ml LV vol d, MOD A4C: 232.0 ml LV vol s, MOD A2C: 81.4 ml LV vol s, MOD A4C: 111.0 ml LV SV MOD A2C:     115.6 ml LV SV MOD A4C:     232.0 ml LV SV MOD BP:      120.7 ml RIGHT VENTRICLE RV S prime:     15.50 cm/s  TAPSE (M-mode): 3.2 cm LEFT ATRIUM         Index LA diam:    3.60 cm 1.48 cm/m  AORTIC VALVE LVOT Vmax:   91.20 cm/s LVOT Vmean:  65.900 cm/s LVOT VTI:    0.175 m  AORTA Ao Root diam: 3.60 cm MITRAL VALVE               TRICUSPID VALVE MV Area (PHT): 2.99 cm    TR Peak grad:   14.3 mmHg MV Decel Time: 254 msec    TR Vmax:        189.00 cm/s MV E velocity: 41.90 cm/s MV A velocity: 36.10 cm/s  SHUNTS MV E/A ratio:  1.16        Systemic VTI:  0.18 m                            Systemic Diam: 2.40 cm Dani Gobble Croitoru MD Electronically signed by Sanda Klein MD Signature Date/Time: 08/31/2020/3:07:43 PM    Final

## 2020-09-04 NOTE — Plan of Care (Signed)

## 2020-09-04 NOTE — Progress Notes (Signed)
Occupational Therapy Treatment Patient Details Name: Todd [name] MRN: [id number] DOB: 1966-07-04 Today's Date: 09/04/2020    History of present illness 55 y.o. male with medical history significant of HTN, morbid obesity status post gastric bypass in 2018, arthritis, presented with new onset of shortness of breath.  COVID + (unvaccinated); dehydration   OT comments  Pt seated in recliner upon arrival on 12L HFNC with sats 90% upon OTA arrival, pt agreeable to OT intervention. Pt able to complete 5 laps in room on 10L HFNC with gross supervision, difficult to obtain accurate O2 reading but feel pt with drop to at least low 80s. Once pt seated in recliner able to obtain reading of 83% with pt needing ~ 3 mins in chair to rebound to >90%. Pt completed x10 sit<>stands from recliner with gross supervision to increase strength and endurance for higher level functional mobility tasks. Pt left seated in recliner on 11L with sats 98% at rest. Dc plan remains appropriate, will continue to follow acutely per POC.   Follow Up Recommendations  No OT follow up    Equipment Recommendations  Tub/shower seat    Recommendations for Other Services      Precautions / Restrictions Precautions Precautions: Fall Precaution Comments: walked in room on 10 L HFNC Restrictions Weight Bearing Restrictions: No       Mobility Bed Mobility               General bed mobility comments: pt OOB in recliner upon arrival and at conclusion of session  Transfers Overall transfer level: Needs assistance Equipment used: None Transfers: Sit to/from Stand Sit to Stand: Supervision         General transfer comment: gross supervision with pt able to complete 11 sit<>stands from recliner    Balance Overall balance assessment: Mild deficits observed, not formally tested                                         ADL either performed or assessed with clinical judgement   ADL                                                Vision       Perception     Praxis      Cognition Arousal/Alertness: Awake/alert Behavior During Therapy: WFL for tasks assessed/performed Overall Cognitive Status: Within Functional Limits for tasks assessed                                 General Comments: very motivated        Exercises Other Exercises Other Exercises: x10 sit<>stands from recliner with gross supervision   Shoulder Instructions       General Comments pt on 12L HFNC upon arrival with sats 9Os. pt completed functional mobility in room on 10 L, difficult to obtain accurate reading but feel sure pt desat to at least lows 80s, however able to rebound to >90% with seated rest break and PLB. pt left on 11L HFNC with sats 97% at rest. pt reports having just used IS and flutter valve 5 mins from OTA arrival, encourage continued use of devices    Pertinent Vitals/ Pain  Pain Assessment: No/denies pain  Home Living                                          Prior Functioning/Environment              Frequency  Min 3X/week        Progress Toward Goals  OT Goals(current goals can now be found in the care plan section)  Progress towards OT goals: Progressing toward goals  Acute Rehab OT Goals Patient Stated Goal: return home ASAP OT Goal Formulation: With patient Time For Goal Achievement: 09/17/20 Potential to Achieve Goals: Good  Plan Discharge plan remains appropriate;Frequency remains appropriate    Co-evaluation                 AM-PAC OT "6 Clicks" Daily Activity     Outcome Measure   Help from another person eating meals?: None Help from another person taking care of personal grooming?: None Help from another person toileting, which includes using toliet, bedpan, or urinal?: A Little Help from another person bathing (including washing, rinsing, drying)?: A Little Help from another person to put  on and taking off regular upper body clothing?: None Help from another person to put on and taking off regular lower body clothing?: A Little 6 Click Score: 21    End of Session Equipment Utilized During Treatment: Oxygen;Other (comment) (10-11L HFNC, pt rolling O2 cart as AD)  OT Visit Diagnosis: Other abnormalities of gait and mobility (R26.89)   Activity Tolerance Patient tolerated treatment well   Patient Left in chair;with call bell/phone within reach   Nurse Communication Mobility status;Other (comment) (left pt on 11l HFNC with sats 97%)        Time: 1500-1520 OT Time Calculation (min): 20 min  Charges: OT General Charges $OT Visit: 1 Visit OT Treatments $Therapeutic Activity: 8-22 mins  Todd [name]., COTA/L Acute Rehabilitation Services [phone]    Todd [name] 09/04/2020, 3:57 PM

## 2020-09-05 LAB — CULTURE, BLOOD (ROUTINE X 2): Culture: NO GROWTH

## 2020-09-05 LAB — CBC
HCT: 39.6 % (ref 39.0–52.0)
Hemoglobin: 13.4 g/dL (ref 13.0–17.0)
MCH: 27.5 pg (ref 26.0–34.0)
MCHC: 33.8 g/dL (ref 30.0–36.0)
MCV: 81.1 fL (ref 80.0–100.0)
Platelets: 189 10*3/uL (ref 150–400)
RBC: 4.88 MIL/uL (ref 4.22–5.81)
RDW: 13.3 % (ref 11.5–15.5)
WBC: 7.5 10*3/uL (ref 4.0–10.5)
nRBC: 0.3 % — ABNORMAL HIGH (ref 0.0–0.2)

## 2020-09-05 LAB — COMPREHENSIVE METABOLIC PANEL
ALT: 74 U/L — ABNORMAL HIGH (ref 0–44)
AST: 51 U/L — ABNORMAL HIGH (ref 15–41)
Albumin: 2.4 g/dL — ABNORMAL LOW (ref 3.5–5.0)
Alkaline Phosphatase: 38 U/L (ref 38–126)
Anion gap: 7 (ref 5–15)
BUN: 26 mg/dL — ABNORMAL HIGH (ref 6–20)
CO2: 26 mmol/L (ref 22–32)
Calcium: 7.9 mg/dL — ABNORMAL LOW (ref 8.9–10.3)
Chloride: 102 mmol/L (ref 98–111)
Creatinine, Ser: 0.9 mg/dL (ref 0.61–1.24)
GFR, Estimated: >60 mL/min (ref >60)
Glucose, Bld: 139 mg/dL — ABNORMAL HIGH (ref 70–99)
Potassium: 3.9 mmol/L (ref 3.5–5.1)
Sodium: 135 mmol/L (ref 135–145)
Total Bilirubin: 1.2 mg/dL (ref 0.3–1.2)
Total Protein: 5.2 g/dL — ABNORMAL LOW (ref 6.5–8.1)

## 2020-09-05 LAB — GLUCOSE, CAPILLARY
Glucose-Capillary: 90 mg/dL (ref 70–99)
Glucose-Capillary: 81 mg/dL (ref 70–99)
Glucose-Capillary: 102 mg/dL — ABNORMAL HIGH (ref 70–99)
Glucose-Capillary: 130 mg/dL — ABNORMAL HIGH (ref 70–99)

## 2020-09-05 LAB — D-DIMER, QUANTITATIVE: D-Dimer, Quant: 2.05 ug/mL-FEU — ABNORMAL HIGH (ref 0.00–0.50)

## 2020-09-05 MED ORDER — FUROSEMIDE 10 MG/ML IJ SOLN
40.0000 mg | Freq: Once | INTRAMUSCULAR | Status: AC
  Administered 2020-09-05: 40 mg via INTRAVENOUS
  Filled 2020-09-05: qty 4

## 2020-09-05 NOTE — Progress Notes (Signed)
PROGRESS NOTE                                                                                                                                                                                                             Patient Demographics:    Todd [name], is a 55 y.o. male, DOB - [birth year], [id number]  Outpatient Primary MD for the patient is Seward Carol, MD    LOS - 6  Admit date - 08/30/2020    Chief Complaint  Patient presents with  . Shortness of Breath       Brief Narrative (HPI from H&P) Todd [name] is a 55 y.o. male with medical history significant of HTN, morbid obesity status post gastric bypass in 2018, arthritis, presented with new onset of shortness of breath.  Symptoms started 5 days before hospital visit, he was diagnosed with acute hypoxic respiratory failure due to COVID-19 pneumonia and admitted to the hospital   Subjective:   Patient seen and examined.  Cough, report dyspnea has improved, reports appetite has improved as well, nasal congestion has improved .    Assessment  & Plan :   Acute Hypoxic Resp. Failure due to Acute Covid 19 Viral Pneumonitis during the ongoing 2020 Covid 19 Pandemic  - he is unfortunately not vaccinated  - he  has incurred severe parenchymal lung injury due to COVID-19 pneumonia causing ARDS.  -Continue with IV Solu-Medrol -Treated with Remdesivir  -He received Actemra . -Respiratory status remains very tenuous, he is on 12 L high flow nasal cannula at rest .  Used to be on  35 L heated high flow nasal cannula initially. -Continue to trend inflammatory markers, D-dimers is 2.4, will obtain venous Dopplers, continue with DVT prophylaxis dose currently . -Encouraged the patient to sit up in chair in the daytime use I-S and flutter valve for pulmonary toiletry and then prone in bed when at night.  Will advance activity and titrate down oxygen as possible. -Some  mild lower extremity edema, will give IV Lasix once.     SpO2: 93 % O2 Flow Rate (L/min): 12 L/min FiO2 (%): 100 %  Recent Labs  Lab 08/30/20 1312 08/30/20 1313 08/30/20 1820 08/30/20 1821 08/31/20 0409 08/31/20 0815 09/01/20 0103 09/02/20 0054 09/03/20 0051 09/04/20 0341 09/05/20 0117  WBC  --    < >  --   --  9.5  --  5.1 8.8 8.8 7.9 7.5  HGB  --    < >  --   --  13.5  --  14.0 13.9 13.7 13.9 13.4  HCT  --    < >  --   --  40.7  --  43.1 42.0 42.5 43.4 39.6  PLT  --    < >  --   --  88*  --  128* 149* 170 188 189  CRP  --    < > 21.0*  --  19.3*  --  19.2* 8.2* 3.7* 1.8*  --   BNP  --   --   --   --   --  117.7* 80.0 86.2 53.7 39.9  --   DDIMER  --    < > 1.17*  --  1.21*  --  1.71* 1.92* 1.77* 2.40* 2.05*  PROCALCITON  --   --  0.44  --   --  0.71 0.51 0.33  --   --   --   AST  --    < > 48*  --  46*  --  42* 34 41 53* 51*  ALT  --    < > 29  --  27  --  29 31 38 68* 74*  ALKPHOS  --    < > 55  --  50  --  52 48 47 46 38  BILITOT  --    < > 1.4*  --  1.1  --  1.1 1.0 0.9 1.1 1.2  ALBUMIN  --    < > 3.0*  --  2.5*  --  2.4* 2.3* 2.3* 2.4* 2.4*  LATICACIDVEN  --   --   --  1.2  --   --   --   --   --   --   --   SARSCOV2NAA POSITIVE*  --   --   --   --   --   --   --   --   --   --    < > = values in this interval not displayed.      Severe dehydration.   - Resolved.  HX of gastric bypass.   - Supportive care.  GERD.   - PPI.  Hypertension.   - Blood pressure well controlled.  Continue as needed hydralazine.  Hypoxia induced mild bump in troponin, chest pain-free, EKG nonacute.  Is on low-dose aspirin and echo shows normal ejection fraction, no WMA.  Epistaxis: Brief episode on 09/01/2020.  No further episodes.  Monitor closely.  Condition - Extremely Guarded  Family Communication  : Patient will update his family himself.  Code Status :  Full  Consults  :  None  Procedures  :    PUD Prophylaxis : PPI  Disposition Plan  : To be determined.  May  need several more days in the hospital.  Status is: Inpatient  Remains inpatient appropriate because:IV treatments appropriate due to intensity of illness or inability to take PO   Dispo: The patient is from: Home              Anticipated d/c is to: Home              Anticipated d/c date is: > 3 days              Patient currently is not medically stable to d/c.   DVT Prophylaxis  :  Lovenox  Lab Results  Component Value Date   PLT 189 09/05/2020    Diet :  Diet Order            Diet 2 gram sodium Room service appropriate? Yes; Fluid consistency: Thin  Diet effective now                  Inpatient Medications  Scheduled Meds: . vitamin C with rose hips  1,000 mg Oral Daily  . aspirin  81 mg Oral Daily  . cholecalciferol  1,000 Units Oral Daily  . enoxaparin (LOVENOX) injection  60 mg Subcutaneous Q24H  . insulin aspart  0-15 Units Subcutaneous TID WC  . insulin aspart  0-5 Units Subcutaneous QHS  . Ipratropium-Albuterol  1 puff Inhalation Q6H  . methylPREDNISolone (SOLU-MEDROL) injection  60 mg Intravenous Q12H  . pantoprazole  40 mg Oral Daily  . zinc sulfate  220 mg Oral Daily   Continuous Infusions:  PRN Meds:.acetaminophen, guaiFENesin-dextromethorphan, hydrALAZINE, lip balm, [DISCONTINUED] ondansetron **OR** ondansetron (ZOFRAN) IV, simethicone, sodium chloride  Antibiotics  :    Anti-infectives (From admission, onward)   Start     Dose/Rate Route Frequency Ordered Stop   08/31/20 1000  remdesivir 100 mg in sodium chloride 0.9 % 100 mL IVPB       "Followed by" Linked Group Details   100 mg 200 mL/hr over 30 Minutes Intravenous Daily 08/30/20 1742 09/03/20 1030   08/30/20 1745  remdesivir 200 mg in sodium chloride 0.9% 250 mL IVPB       "Followed by" Linked Group Details   200 mg 580 mL/hr over 30 Minutes Intravenous Once 08/30/20 1742 08/30/20 2121       Emeline Gins Herma Uballe M.D on 09/05/2020 at 11:51 AM  To page go to [email or web address]   [facility] -  Office  [phone]    See all Orders from today for further details    Objective:   Vitals:   09/04/20 0440 09/04/20 1548 09/04/20 1948 09/05/20 0437  BP: 129/74 104/66 95/84 102/62  Pulse: 60 69 75 66  Resp: (!) 23  20 20   Temp: 98 F (36.7 C) 97.8 F (36.6 C) 98.4 F (36.9 C) 97.7 F (36.5 C)  TempSrc: Oral Oral Axillary Axillary  SpO2: 91% 94% 90% 93%  Weight:      Height:        Wt Readings from Last 3 Encounters:  08/31/20 119.7 kg  08/07/17 (!) 217.7 kg  04/03/16 (!) 217.7 kg     Intake/Output Summary (Last 24 hours) at 09/05/2020 1151 Last data filed at 09/05/2020 0439 Gross per 24 hour  Intake 360 ml  Output 401 ml  Net -41 ml     Physical Exam  Awake Alert, Oriented X 3, No new F.N deficits, Normal affect Symmetrical Chest wall movement, Good air movement bilaterally, CTAB RRR,No Gallops,Rubs or new Murmurs, No Parasternal Heave +ve B.Sounds, Abd Soft, No tenderness, No rebound - guarding or rigidity. No Cyanosis, Clubbing ,+1edema, No new Rash or bruise      Data Review:    CBC Recent Labs  Lab 08/31/20 0409 09/01/20 0103 09/02/20 0054 09/03/20 0051 09/04/20 0341 09/05/20 0117  WBC 9.5 5.1 8.8 8.8 7.9 7.5  HGB 13.5 14.0 13.9 13.7 13.9 13.4  HCT 40.7 43.1 42.0 42.5 43.4 39.6  PLT 88* 128* 149* 170 188 189  MCV 84.8 83.5 83.0 83.3 83.6 81.1  MCH 28.1 27.1 27.5 26.9 26.8 27.5  MCHC 33.2 32.5 33.1 32.2 32.0 33.8  RDW 13.7 13.4 13.6 13.2 13.3 13.3  LYMPHSABS 1.0 0.3* 1.2 0.9 0.7  --   MONOABS 0.4 0.2 0.4 0.4 0.4  --   EOSABS 0.0 0.0 0.0 0.0 0.0  --   BASOSABS 0.0 0.0 0.0 0.0 0.0  --     Recent Labs  Lab 08/30/20 1820 08/30/20 1821 08/31/20 0409 08/31/20 0815 09/01/20 0103 09/02/20 0054 09/03/20 0051 09/04/20 0341 09/05/20 0117  NA  --   --  136  --  137 137 136 136 135  K  --   --  3.6  --  3.6 4.1 3.8 4.3 3.9  CL  --   --  100  --  100 100 99 98 102  CO2  --   --  25  --  28 29 26 31 26   GLUCOSE  --   --   116*  --  137* 156* 161* 132* 139*  BUN  --   --  15  --  20 23* 27* 27* 26*  CREATININE  --   --  0.94  --  0.86 0.99 0.96 1.02 0.90  CALCIUM  --   --  8.2*  --  8.5* 8.4* 8.1* 8.3* 7.9*  AST 48*  --  46*  --  42* 34 41 53* 51*  ALT 29  --  27  --  29 31 38 68* 74*  ALKPHOS 55  --  50  --  52 48 47 46 38  BILITOT 1.4*  --  1.1  --  1.1 1.0 0.9 1.1 1.2  ALBUMIN 3.0*  --  2.5*  --  2.4* 2.3* 2.3* 2.4* 2.4*  MG  --   --  2.1  --  2.2 2.3 2.2 2.3  --   CRP 21.0*  --  19.3*  --  19.2* 8.2* 3.7* 1.8*  --   DDIMER 1.17*  --  1.21*  --  1.71* 1.92* 1.77* 2.40* 2.05*  PROCALCITON 0.44  --   --  0.71 0.51 0.33  --   --   --   LATICACIDVEN  --  1.2  --   --   --   --   --   --   --   HGBA1C  --   --   --  5.1  --   --   --   --   --   BNP  --   --   --  117.7* 80.0 86.2 53.7 39.9  --     ------------------------------------------------------------------------------------------------------------------ No results for input(s): CHOL, HDL, LDLCALC, TRIG, CHOLHDL, LDLDIRECT in the last 72 hours.  Lab Results  Component Value Date   HGBA1C 5.1 08/31/2020   ------------------------------------------------------------------------------------------------------------------ No results for input(s): TSH, T4TOTAL, T3FREE, THYROIDAB in the last 72 hours.  Invalid input(s): FREET3  Cardiac Enzymes No results for input(s): CKMB, TROPONINI, MYOGLOBIN in the last 168 hours.  Invalid input(s): CK ------------------------------------------------------------------------------------------------------------------    Component Value Date/Time   BNP 39.9 09/04/2020 0341    Micro Results Recent Results (from the past 240 hour(s))  Resp Panel by RT-PCR (Flu A&B, Covid) Nasopharyngeal Swab     Status: Abnormal   Collection Time: 08/30/20  1:12 PM   Specimen: Nasopharyngeal Swab; Nasopharyngeal(NP) swabs in vial transport medium  Result Value Ref Range Status   SARS Coronavirus 2 by RT PCR POSITIVE (A)  NEGATIVE Final    Comment: emailed L. Berdik RN 14:20 08/30/20 ([email or web address]) (NOTE) SARS-CoV-2 target nucleic acids are DETECTED.  The SARS-CoV-2 RNA  is generally detectable in upper respiratory specimens during the acute phase of infection. Positive results are indicative of the presence of the identified virus, but do not rule out bacterial infection or co-infection with other pathogens not detected by the test. Clinical correlation with patient history and other diagnostic information is necessary to determine patient infection status. The expected result is Negative.  Fact Sheet for Patients: EntrepreneurPulse.com.au  Fact Sheet for Healthcare Providers: IncredibleEmployment.be  This test is not yet approved or cleared by the [place] FDA and  has been authorized for detection and/or diagnosis of SARS-CoV-2 by FDA under an Emergency Use Authorization (EUA).  This EUA will remain in effect (meaning this test can be used) for the duration of  the COVID- 19 declaration under Section 564(b)(1) of the Act, 21 U.S.C. section 360bbb-3(b)(1), unless the authorization is terminated or revoked sooner.     Influenza A by PCR NEGATIVE NEGATIVE Final   Influenza B by PCR NEGATIVE NEGATIVE Final    Comment: (NOTE) The Xpert Xpress SARS-CoV-2/FLU/RSV plus assay is intended as an aid in the diagnosis of influenza from Nasopharyngeal swab specimens and should not be used as a sole basis for treatment. Nasal washings and aspirates are unacceptable for Xpert Xpress SARS-CoV-2/FLU/RSV testing.  Fact Sheet for Patients: EntrepreneurPulse.com.au  Fact Sheet for Healthcare Providers: IncredibleEmployment.be  This test is not yet approved or cleared by the [place] FDA and has been authorized for detection and/or diagnosis of SARS-CoV-2 by FDA under an Emergency Use Authorization (EUA). This EUA will remain in  effect (meaning this test can be used) for the duration of the COVID-19 declaration under Section 564(b)(1) of the Act, 21 U.S.C. section 360bbb-3(b)(1), unless the authorization is terminated or revoked.  Performed at [facility], [place] [address], [place], [place] [address]   Blood Culture (routine x 2)     Status: None   Collection Time: 08/30/20  4:04 PM   Specimen: BLOOD RIGHT ARM  Result Value Ref Range Status   Specimen Description BLOOD RIGHT ARM  Final   Special Requests   Final    BOTTLES DRAWN AEROBIC AND ANAEROBIC Blood Culture adequate volume   Culture   Final    NO GROWTH 5 DAYS Performed at [facility], [place] [address], [place], [place] [address]    Report Status 09/04/2020 FINAL  Final  Blood Culture (routine x 2)     Status: None   Collection Time: 08/31/20  8:15 AM   Specimen: BLOOD LEFT WRIST  Result Value Ref Range Status   Specimen Description BLOOD LEFT WRIST  Final   Special Requests   Final    BOTTLES DRAWN AEROBIC AND ANAEROBIC Blood Culture results may not be optimal due to an inadequate volume of blood received in culture bottles   Culture   Final    NO GROWTH 5 DAYS Performed at [facility], [place] [address], [place], [place] [address]    Report Status 09/05/2020 FINAL  Final    Radiology Reports DG Chest Portable 1 View  Result Date: 08/30/2020 CLINICAL DATA:  55 year old male with hypoxia. Positive COVID-19. EXAM: PORTABLE CHEST 1 VIEW COMPARISON:  Chest radiograph dated 11/18/2010. FINDINGS: There is cardiomegaly with vascular congestion. Bilateral mid to lower lung field predominant hazy opacities consistent with multifocal pneumonia and in keeping with COVID-19. Clinical correlation is recommended. There is no pleural effusion pneumothorax. No acute osseous pathology. IMPRESSION: 1. Multifocal pneumonia. 2. Cardiomegaly with vascular congestion. Electronically Signed  By: Anner Crete M.D.   On: 08/30/2020 16:09    ECHOCARDIOGRAM LIMITED  Result Date: 08/31/2020    ECHOCARDIOGRAM LIMITED REPORT   Patient Name:   GADDIS KOSER Date of Exam: 08/31/2020 Medical Rec #:  [id number]       Height:       72.0 in Accession #:    [id number]      Weight:       272.1 lb Date of Birth:  [birth year]       BSA:          2.428 m Patient Age:    50 years        BP:           125/72 mmHg Patient Gender: M               HR:           68 bpm. Exam Location:  Inpatient Procedure: Limited Echo, Cardiac Doppler and Color Doppler Indications:    CHF-Acute Diastolic [id number] / [id number]  History:        Patient has no prior history of Echocardiogram examinations.                 Risk Factors:Hypertension and Non-Smoker.  Sonographer:    Vickie Epley RDCS Referring Phys: [id number] Margaree Mackintosh [facility]  Sonographer Comments: Covid positive. IMPRESSIONS  1. Left ventricular ejection fraction, by estimation, is 55 to 60%. The left ventricle has normal function. The left ventricle has no regional wall motion abnormalities. Left ventricular diastolic parameters were normal.  2. Right ventricular systolic function is normal. The right ventricular size is normal. There is normal pulmonary artery systolic pressure.  3. The mitral valve is normal in structure. Trivial mitral valve regurgitation. No evidence of mitral stenosis.  4. The aortic valve is normal in structure. Aortic valve regurgitation is not visualized. No aortic stenosis is present.  5. The inferior vena cava is normal in size with greater than 50% respiratory variability, suggesting right atrial pressure of 3 mmHg. FINDINGS  Left Ventricle: Left ventricular ejection fraction, by estimation, is 55 to 60%. The left ventricle has normal function. The left ventricle has no regional wall motion abnormalities. The left ventricular internal cavity size was normal in size. There is  borderline concentric left ventricular hypertrophy. Left ventricular diastolic parameters were normal. Normal left ventricular  filling pressure. Right Ventricle: The right ventricular size is normal. No increase in right ventricular wall thickness. Right ventricular systolic function is normal. There is normal pulmonary artery systolic pressure. The tricuspid regurgitant velocity is 1.89 m/s, and  with an assumed right atrial pressure of 3 mmHg, the estimated right ventricular systolic pressure is [id number]. Left Atrium: Left atrial size was normal in size. Right Atrium: Right atrial size was normal in size. Pericardium: There is no evidence of pericardial effusion. Mitral Valve: The mitral valve is normal in structure. Trivial mitral valve regurgitation. No evidence of mitral valve stenosis. Tricuspid Valve: The tricuspid valve is normal in structure. Tricuspid valve regurgitation is not demonstrated. No evidence of tricuspid stenosis. Aortic Valve: The aortic valve is normal in structure. Aortic valve regurgitation is not visualized. No aortic stenosis is present. Pulmonic Valve: The pulmonic valve was normal in structure. Pulmonic valve regurgitation is not visualized. No evidence of pulmonic stenosis. Aorta: The aortic root is normal in size and structure. Venous: The inferior vena cava is normal in size with greater than 50% respiratory variability, suggesting right atrial pressure of  3 mmHg. IAS/Shunts: No atrial level shunt detected by color flow Doppler. LEFT VENTRICLE PLAX 2D LVIDd:         5.28 cm      Diastology LVIDs:         3.70 cm      LV e' medial:    6.41 cm/s LV PW:         1.10 cm      LV E/e' medial:  6.5 LV IVS:        1.10 cm      LV e' lateral:   8.51 cm/s LVOT diam:     2.40 cm      LV E/e' lateral: 4.9 LV SV:         79 LV SV Index:   33 LVOT Area:     4.52 cm  LV Volumes (MOD) LV vol d, MOD A2C: 197.0 ml LV vol d, MOD A4C: 232.0 ml LV vol s, MOD A2C: 81.4 ml LV vol s, MOD A4C: 111.0 ml LV SV MOD A2C:     115.6 ml LV SV MOD A4C:     232.0 ml LV SV MOD BP:      120.7 ml RIGHT VENTRICLE RV S prime:     15.50 cm/s  TAPSE (M-mode): 3.2 cm LEFT ATRIUM         Index LA diam:    3.60 cm 1.48 cm/m  AORTIC VALVE LVOT Vmax:   91.20 cm/s LVOT Vmean:  65.900 cm/s LVOT VTI:    0.175 m  AORTA Ao Root diam: 3.60 cm MITRAL VALVE               TRICUSPID VALVE MV Area (PHT): 2.99 cm    TR Peak grad:   14.3 mmHg MV Decel Time: 254 msec    TR Vmax:        189.00 cm/s MV E velocity: 41.90 cm/s MV A velocity: 36.10 cm/s  SHUNTS MV E/A ratio:  1.16        Systemic VTI:  0.18 m                            Systemic Diam: 2.40 cm Dani Gobble Croitoru MD Electronically signed by Sanda Klein MD Signature Date/Time: 08/31/2020/3:07:43 PM    Final

## 2020-09-06 ENCOUNTER — Inpatient Hospital Stay (HOSPITAL_COMMUNITY): Payer: BC Managed Care – PPO

## 2020-09-06 DIAGNOSIS — U071 COVID-19: Secondary | ICD-10-CM

## 2020-09-06 DIAGNOSIS — R7989 Other specified abnormal findings of blood chemistry: Secondary | ICD-10-CM

## 2020-09-06 LAB — COMPREHENSIVE METABOLIC PANEL
ALT: 65 U/L — ABNORMAL HIGH (ref 0–44)
AST: 36 U/L (ref 15–41)
Albumin: 2.3 g/dL — ABNORMAL LOW (ref 3.5–5.0)
Alkaline Phosphatase: 39 U/L (ref 38–126)
Anion gap: 8 (ref 5–15)
BUN: 27 mg/dL — ABNORMAL HIGH (ref 6–20)
CO2: 26 mmol/L (ref 22–32)
Calcium: 8.1 mg/dL — ABNORMAL LOW (ref 8.9–10.3)
Chloride: 101 mmol/L (ref 98–111)
Creatinine, Ser: 0.93 mg/dL (ref 0.61–1.24)
GFR, Estimated: >60 mL/min (ref >60)
Glucose, Bld: 126 mg/dL — ABNORMAL HIGH (ref 70–99)
Potassium: 3.9 mmol/L (ref 3.5–5.1)
Sodium: 135 mmol/L (ref 135–145)
Total Bilirubin: 1.2 mg/dL (ref 0.3–1.2)
Total Protein: 5 g/dL — ABNORMAL LOW (ref 6.5–8.1)

## 2020-09-06 LAB — GLUCOSE, CAPILLARY
Glucose-Capillary: 77 mg/dL (ref 70–99)
Glucose-Capillary: 95 mg/dL (ref 70–99)
Glucose-Capillary: 110 mg/dL — ABNORMAL HIGH (ref 70–99)
Glucose-Capillary: 125 mg/dL — ABNORMAL HIGH (ref 70–99)

## 2020-09-06 LAB — CBC
HCT: 40.8 % (ref 39.0–52.0)
Hemoglobin: 13.2 g/dL (ref 13.0–17.0)
MCH: 27 pg (ref 26.0–34.0)
MCHC: 32.4 g/dL (ref 30.0–36.0)
MCV: 83.4 fL (ref 80.0–100.0)
Platelets: 195 10*3/uL (ref 150–400)
RBC: 4.89 MIL/uL (ref 4.22–5.81)
RDW: 13.3 % (ref 11.5–15.5)
WBC: 8 10*3/uL (ref 4.0–10.5)
nRBC: 0 % (ref 0.0–0.2)

## 2020-09-06 LAB — MAGNESIUM: Magnesium: 2.4 mg/dL (ref 1.7–2.4)

## 2020-09-06 LAB — D-DIMER, QUANTITATIVE: D-Dimer, Quant: 1.63 ug/mL-FEU — ABNORMAL HIGH (ref 0.00–0.50)

## 2020-09-06 MED ORDER — FUROSEMIDE 20 MG PO TABS
20.0000 mg | ORAL_TABLET | Freq: Every day | ORAL | Status: DC
  Administered 2020-09-06 – 2020-09-07 (×2): 20 mg via ORAL
  Filled 2020-09-06 (×2): qty 1

## 2020-09-06 NOTE — CV Procedure (Signed)
BLE venous duplex completed.  Results can be found under chart review under CV PROC. 09/06/2020 4:15 PM Maddalyn Lutze RVT, RDMS

## 2020-09-06 NOTE — Progress Notes (Signed)
PROGRESS NOTE                                                                                                                                                                                                             Patient Demographics:    Todd [name], is a 55 y.o. male, DOB - May 09, 1966, [id number]  Outpatient Primary MD for the patient is Seward Carol, MD    LOS - 7  Admit date - 08/30/2020    Chief Complaint  Patient presents with  . Shortness of Breath       Brief Narrative (HPI from H&P)  - Todd [name] is a 55 y.o. male with medical history significant of HTN, morbid obesity status post gastric bypass in 2018, arthritis, presented with new onset of shortness of breath.  Symptoms started 5 days before hospital visit, he was diagnosed with acute hypoxic respiratory failure due to COVID-19 pneumonia and admitted to the hospital   Subjective:   Patient seen and examined.  Dyspnea has improved, was able to ambulate in the room yesterday .     Assessment  & Plan :   Acute Hypoxic Resp. Failure due to Acute Covid 19 Viral Pneumonitis during the ongoing 2020 Covid 19 Pandemic  - he is unfortunately not vaccinated  - he  has incurred severe parenchymal lung injury due to COVID-19 pneumonia causing ARDS.  -Continue with IV Solu-Medrol -Treated with Remdesivir  -He received Actemra . -Respiratory status remains very tenuous, improving oxygen requirement, he is on 10 L high flow nasal cannula at rest this morning, discussed with staff, will try to ambulate.  Used to be on  35 L heated high flow nasal cannula initially. -Continue to trend inflammatory markers, dimer is elevated, obtain venous Doppler which is negative for DVT. -Encouraged the patient to sit up in chair in the daytime use I-S and flutter valve for pulmonary toiletry and then prone in bed when at night.  Will advance activity and titrate down  oxygen as possible. -will Keep on low-dose Lasix daily for lower extremity edema, has been improving.     SpO2: 92 % O2 Flow Rate (L/min): 10 L/min FiO2 (%): 100 %  Recent Labs  Lab 08/30/20 1820 08/30/20 1820 08/30/20 1821 08/31/20 0409 08/31/20 0815 09/01/20 0103 09/02/20 0054 09/03/20 0051 09/04/20 0341 09/05/20 0117 09/06/20  0305  WBC  --    < >  --  9.5  --  5.1 8.8 8.8 7.9 7.5 8.0  HGB  --    < >  --  13.5  --  14.0 13.9 13.7 13.9 13.4 13.2  HCT  --    < >  --  40.7  --  43.1 42.0 42.5 43.4 39.6 40.8  PLT  --    < >  --  88*  --  128* 149* 170 188 189 195  CRP 21.0*  --   --  19.3*  --  19.2* 8.2* 3.7* 1.8*  --   --   BNP  --   --   --   --  117.7* 80.0 86.2 53.7 39.9  --   --   DDIMER 1.17*  --   --  1.21*  --  1.71* 1.92* 1.77* 2.40* 2.05* 1.63*  PROCALCITON 0.44  --   --   --  0.71 0.51 0.33  --   --   --   --   AST 48*  --   --  46*  --  42* 34 41 53* 51* 36  ALT 29  --   --  27  --  29 31 38 68* 74* 65*  ALKPHOS 55  --   --  50  --  52 48 47 46 38 39  BILITOT 1.4*  --   --  1.1  --  1.1 1.0 0.9 1.1 1.2 1.2  ALBUMIN 3.0*  --   --  2.5*  --  2.4* 2.3* 2.3* 2.4* 2.4* 2.3*  LATICACIDVEN  --   --  1.2  --   --   --   --   --   --   --   --    < > = values in this interval not displayed.      Severe dehydration.   - Resolved.  HX of gastric bypass.   - Supportive care.  GERD.   - PPI.  Hypertension.   - Blood pressure well controlled.  Continue as needed hydralazine.  Hypoxia induced mild bump in troponin, chest pain-free, EKG nonacute.  Is on low-dose aspirin and echo shows normal ejection fraction, no WMA.  Epistaxis: Brief episode on 09/01/2020.  No further episodes.  Monitor closely.  Condition - Extremely Guarded  Family Communication  : Patient will update his family himself.  Code Status :  Full  Consults  :  None  Procedures  :    PUD Prophylaxis : PPI  Disposition Plan  : To be determined.  May need several more days in the  hospital.  Status is: Inpatient  Remains inpatient appropriate because:IV treatments appropriate due to intensity of illness or inability to take PO   Dispo: The patient is from: Home              Anticipated d/c is to: Home              Anticipated d/c date is: > 3 days              Patient currently is not medically stable to d/c.   DVT Prophylaxis  :  Lovenox    Lab Results  Component Value Date   PLT 195 09/06/2020    Diet :  Diet Order            Diet 2 gram sodium Room service appropriate? Yes; Fluid consistency: Thin  Diet effective  now                  Inpatient Medications  Scheduled Meds: . vitamin C with rose hips  1,000 mg Oral Daily  . aspirin  81 mg Oral Daily  . cholecalciferol  1,000 Units Oral Daily  . enoxaparin (LOVENOX) injection  60 mg Subcutaneous Q24H  . insulin aspart  0-15 Units Subcutaneous TID WC  . insulin aspart  0-5 Units Subcutaneous QHS  . Ipratropium-Albuterol  1 puff Inhalation Q6H  . methylPREDNISolone (SOLU-MEDROL) injection  60 mg Intravenous Q12H  . pantoprazole  40 mg Oral Daily  . zinc sulfate  220 mg Oral Daily   Continuous Infusions:  PRN Meds:.acetaminophen, guaiFENesin-dextromethorphan, hydrALAZINE, lip balm, [DISCONTINUED] ondansetron **OR** ondansetron (ZOFRAN) IV, simethicone, sodium chloride  Antibiotics  :    Anti-infectives (From admission, onward)   Start     Dose/Rate Route Frequency Ordered Stop   08/31/20 1000  remdesivir 100 mg in sodium chloride 0.9 % 100 mL IVPB       "Followed by" Linked Group Details   100 mg 200 mL/hr over 30 Minutes Intravenous Daily 08/30/20 1742 09/03/20 1030   08/30/20 1745  remdesivir 200 mg in sodium chloride 0.9% 250 mL IVPB       "Followed by" Linked Group Details   200 mg 580 mL/hr over 30 Minutes Intravenous Once 08/30/20 1742 08/30/20 2121       Emeline Gins Righteous Claiborne M.D on 09/06/2020 at 2:23 PM  To page go to [email or web address]   [facility] -  Office  [phone]     See all Orders from today for further details    Objective:   Vitals:   09/05/20 2100 09/05/20 2127 09/06/20 0500 09/06/20 1302  BP:  (!) 88/62 113/66 106/66  Pulse:  62 (!) 58 76  Resp:  18 18 18   Temp:  97.7 F (36.5 C) 97.7 F (36.5 C) 97.7 F (36.5 C)  TempSrc:  Oral Oral Oral  SpO2: 100% 94%  92%  Weight:      Height:        Wt Readings from Last 3 Encounters:  08/31/20 119.7 kg  08/07/17 (!) 217.7 kg  04/03/16 (!) 217.7 kg     Intake/Output Summary (Last 24 hours) at 09/06/2020 1423 Last data filed at 09/06/2020 1407 Gross per 24 hour  Intake 600 ml  Output 2503 ml  Net -1903 ml     Physical Exam Awake Alert, Oriented X 3, No new F.N deficits, Normal affect Symmetrical Chest wall movement, Good air movement bilaterally, CTAB RRR,No Gallops,Rubs or new Murmurs, No Parasternal Heave +ve B.Sounds, Abd Soft, No tenderness, No rebound - guarding or rigidity. No Cyanosis, Clubbing ,trace  edema, No new Rash or bruise       Data Review:    CBC Recent Labs  Lab 08/31/20 0409 09/01/20 0103 09/02/20 0054 09/03/20 0051 09/04/20 0341 09/05/20 0117 09/06/20 0305  WBC 9.5 5.1 8.8 8.8 7.9 7.5 8.0  HGB 13.5 14.0 13.9 13.7 13.9 13.4 13.2  HCT 40.7 43.1 42.0 42.5 43.4 39.6 40.8  PLT 88* 128* 149* 170 188 189 195  MCV 84.8 83.5 83.0 83.3 83.6 81.1 83.4  MCH 28.1 27.1 27.5 26.9 26.8 27.5 27.0  MCHC 33.2 32.5 33.1 32.2 32.0 33.8 32.4  RDW 13.7 13.4 13.6 13.2 13.3 13.3 13.3  LYMPHSABS 1.0 0.3* 1.2 0.9 0.7  --   --   MONOABS 0.4 0.2 0.4 0.4 0.4  --   --  EOSABS 0.0 0.0 0.0 0.0 0.0  --   --   BASOSABS 0.0 0.0 0.0 0.0 0.0  --   --     Recent Labs  Lab 08/30/20 1820 08/30/20 1820 08/30/20 1821 08/31/20 0409 08/31/20 0815 09/01/20 0103 09/02/20 0054 09/03/20 0051 09/04/20 0341 09/05/20 0117 09/06/20 0305  NA  --    < >  --  136  --  137 137 136 136 135 135  K  --    < >  --  3.6  --  3.6 4.1 3.8 4.3 3.9 3.9  CL  --    < >  --  100  --  100 100 99 98  102 101  CO2  --    < >  --  25  --  28 29 26 31 26 26   GLUCOSE  --    < >  --  116*  --  137* 156* 161* 132* 139* 126*  BUN  --    < >  --  15  --  20 23* 27* 27* 26* 27*  CREATININE  --    < >  --  0.94  --  0.86 0.99 0.96 1.02 0.90 0.93  CALCIUM  --    < >  --  8.2*  --  8.5* 8.4* 8.1* 8.3* 7.9* 8.1*  AST 48*  --   --  46*  --  42* 34 41 53* 51* 36  ALT 29  --   --  27  --  29 31 38 68* 74* 65*  ALKPHOS 55  --   --  50  --  52 48 47 46 38 39  BILITOT 1.4*  --   --  1.1  --  1.1 1.0 0.9 1.1 1.2 1.2  ALBUMIN 3.0*  --   --  2.5*  --  2.4* 2.3* 2.3* 2.4* 2.4* 2.3*  MG  --    < >  --  2.1  --  2.2 2.3 2.2 2.3  --  2.4  CRP 21.0*  --   --  19.3*  --  19.2* 8.2* 3.7* 1.8*  --   --   DDIMER 1.17*  --   --  1.21*  --  1.71* 1.92* 1.77* 2.40* 2.05* 1.63*  PROCALCITON 0.44  --   --   --  0.71 0.51 0.33  --   --   --   --   LATICACIDVEN  --   --  1.2  --   --   --   --   --   --   --   --   HGBA1C  --   --   --   --  5.1  --   --   --   --   --   --   BNP  --   --   --   --  117.7* 80.0 86.2 53.7 39.9  --   --    < > = values in this interval not displayed.    ------------------------------------------------------------------------------------------------------------------ No results for input(s): CHOL, HDL, LDLCALC, TRIG, CHOLHDL, LDLDIRECT in the last 72 hours.  Lab Results  Component Value Date   HGBA1C 5.1 08/31/2020   ------------------------------------------------------------------------------------------------------------------ No results for input(s): TSH, T4TOTAL, T3FREE, THYROIDAB in the last 72 hours.  Invalid input(s): FREET3  Cardiac Enzymes No results for input(s): CKMB, TROPONINI, MYOGLOBIN in the last 168 hours.  Invalid input(s): CK ------------------------------------------------------------------------------------------------------------------    Component Value Date/Time  BNP 39.9 09/04/2020 0341    Micro Results Recent Results (from the past 240 hour(s))   Resp Panel by RT-PCR (Flu A&B, Covid) Nasopharyngeal Swab     Status: Abnormal   Collection Time: 08/30/20  1:12 PM   Specimen: Nasopharyngeal Swab; Nasopharyngeal(NP) swabs in vial transport medium  Result Value Ref Range Status   SARS Coronavirus 2 by RT PCR POSITIVE (A) NEGATIVE Final    Comment: emailed L. Berdik RN 14:20 08/30/20 ([email or web address]) (NOTE) SARS-CoV-2 target nucleic acids are DETECTED.  The SARS-CoV-2 RNA is generally detectable in upper respiratory specimens during the acute phase of infection. Positive results are indicative of the presence of the identified virus, but do not rule out bacterial infection or co-infection with other pathogens not detected by the test. Clinical correlation with patient history and other diagnostic information is necessary to determine patient infection status. The expected result is Negative.  Fact Sheet for Patients: EntrepreneurPulse.com.au  Fact Sheet for Healthcare Providers: IncredibleEmployment.be  This test is not yet approved or cleared by the [place] FDA and  has been authorized for detection and/or diagnosis of SARS-CoV-2 by FDA under an Emergency Use Authorization (EUA).  This EUA will remain in effect (meaning this test can be used) for the duration of  the COVID- 19 declaration under Section 564(b)(1) of the Act, 21 U.S.C. section 360bbb-3(b)(1), unless the authorization is terminated or revoked sooner.     Influenza A by PCR NEGATIVE NEGATIVE Final   Influenza B by PCR NEGATIVE NEGATIVE Final    Comment: (NOTE) The Xpert Xpress SARS-CoV-2/FLU/RSV plus assay is intended as an aid in the diagnosis of influenza from Nasopharyngeal swab specimens and should not be used as a sole basis for treatment. Nasal washings and aspirates are unacceptable for Xpert Xpress SARS-CoV-2/FLU/RSV testing.  Fact Sheet for Patients: EntrepreneurPulse.com.au  Fact Sheet for  Healthcare Providers: IncredibleEmployment.be  This test is not yet approved or cleared by the [place] FDA and has been authorized for detection and/or diagnosis of SARS-CoV-2 by FDA under an Emergency Use Authorization (EUA). This EUA will remain in effect (meaning this test can be used) for the duration of the COVID-19 declaration under Section 564(b)(1) of the Act, 21 U.S.C. section 360bbb-3(b)(1), unless the authorization is terminated or revoked.  Performed at [facility], [place] [address], [place], [place] [address]   Blood Culture (routine x 2)     Status: None   Collection Time: 08/30/20  4:04 PM   Specimen: BLOOD RIGHT ARM  Result Value Ref Range Status   Specimen Description BLOOD RIGHT ARM  Final   Special Requests   Final    BOTTLES DRAWN AEROBIC AND ANAEROBIC Blood Culture adequate volume   Culture   Final    NO GROWTH 5 DAYS Performed at [facility], [place] [address], [place], [place] [address]    Report Status 09/04/2020 FINAL  Final  Blood Culture (routine x 2)     Status: None   Collection Time: 08/31/20  8:15 AM   Specimen: BLOOD LEFT WRIST  Result Value Ref Range Status   Specimen Description BLOOD LEFT WRIST  Final   Special Requests   Final    BOTTLES DRAWN AEROBIC AND ANAEROBIC Blood Culture results may not be optimal due to an inadequate volume of blood received in culture bottles   Culture   Final    NO GROWTH 5 DAYS Performed at [facility], [place] [address] [place], [place] [address]  Report Status 09/05/2020 FINAL  Final    Radiology Reports DG Chest Portable 1 View  Result Date: 08/30/2020 CLINICAL DATA:  55 year old male with hypoxia. Positive COVID-19. EXAM: PORTABLE CHEST 1 VIEW COMPARISON:  Chest radiograph dated 11/18/2010. FINDINGS: There is cardiomegaly with vascular congestion. Bilateral mid to lower lung field predominant hazy opacities consistent with multifocal pneumonia and in keeping with  COVID-19. Clinical correlation is recommended. There is no pleural effusion pneumothorax. No acute osseous pathology. IMPRESSION: 1. Multifocal pneumonia. 2. Cardiomegaly with vascular congestion. Electronically Signed   By: Anner Crete M.D.   On: 08/30/2020 16:09   VAS [place] LOWER EXTREMITY VENOUS (DVT)  Result Date: 09/06/2020  Lower Venous DVT Study Other Indications: Covid+ elevated D-dimer. Comparison Study: Prior study available. Performing Technologist: Rogelia Rohrer  Examination Guidelines: A complete evaluation includes B-mode imaging, spectral Doppler, color Doppler, and power Doppler as needed of all accessible portions of each vessel. Bilateral testing is considered an integral part of a complete examination. Limited examinations for reoccurring indications may be performed as noted. The reflux portion of the exam is performed with the patient in reverse Trendelenburg.  +---------+---------------+---------+-----------+----------+--------------+ RIGHT    CompressibilityPhasicitySpontaneityPropertiesThrombus Aging +---------+---------------+---------+-----------+----------+--------------+ CFV      Full           Yes      Yes                                 +---------+---------------+---------+-----------+----------+--------------+ SFJ      Full                                                        +---------+---------------+---------+-----------+----------+--------------+ FV Prox  Full           Yes      Yes                                 +---------+---------------+---------+-----------+----------+--------------+ FV Mid   Full           Yes      Yes                                 +---------+---------------+---------+-----------+----------+--------------+ FV DistalFull           Yes      Yes                                 +---------+---------------+---------+-----------+----------+--------------+ PFV      Full                                                         +---------+---------------+---------+-----------+----------+--------------+ POP      Full           Yes      Yes                                 +---------+---------------+---------+-----------+----------+--------------+  PTV      Full                                                        +---------+---------------+---------+-----------+----------+--------------+ PERO     Full                                                        +---------+---------------+---------+-----------+----------+--------------+   +---------+---------------+---------+-----------+----------+--------------+ LEFT     CompressibilityPhasicitySpontaneityPropertiesThrombus Aging +---------+---------------+---------+-----------+----------+--------------+ CFV      Full           Yes      Yes                                 +---------+---------------+---------+-----------+----------+--------------+ SFJ      Full                                                        +---------+---------------+---------+-----------+----------+--------------+ FV Prox  Full           Yes      Yes                                 +---------+---------------+---------+-----------+----------+--------------+ FV Mid   Full           Yes      Yes                                 +---------+---------------+---------+-----------+----------+--------------+ FV DistalFull           Yes      Yes                                 +---------+---------------+---------+-----------+----------+--------------+ PFV      Full                                                        +---------+---------------+---------+-----------+----------+--------------+ POP      Full           Yes      Yes                                 +---------+---------------+---------+-----------+----------+--------------+ PTV      Full                                                         +---------+---------------+---------+-----------+----------+--------------+  PERO     Full                                                        +---------+---------------+---------+-----------+----------+--------------+     Summary: RIGHT: - There is no evidence of deep vein thrombosis in the lower extremity.  - No cystic structure found in the popliteal fossa.  LEFT: - There is no evidence of deep vein thrombosis in the lower extremity.  - No cystic structure found in the popliteal fossa.  *See table(s) above for measurements and observations.    Preliminary    ECHOCARDIOGRAM LIMITED  Result Date: 08/31/2020    ECHOCARDIOGRAM LIMITED REPORT   Patient Name:   Todd [name] Date of Exam: 08/31/2020 Medical Rec #:  [id number]       Height:       72.0 in Accession #:    [id number]      Weight:       272.1 lb Date of Birth:  03-Sep-1966       BSA:          2.428 m Patient Age:    48 years        BP:           125/72 mmHg Patient Gender: M               HR:           68 bpm. Exam Location:  Inpatient Procedure: Limited Echo, Cardiac Doppler and Color Doppler Indications:    CHF-Acute Diastolic [id number] / [id number]  History:        Patient has no prior history of Echocardiogram examinations.                 Risk Factors:Hypertension and Non-Smoker.  Sonographer:    Vickie Epley RDCS Referring Phys: [id number] Margaree Mackintosh [facility]  Sonographer Comments: Covid positive. IMPRESSIONS  1. Left ventricular ejection fraction, by estimation, is 55 to 60%. The left ventricle has normal function. The left ventricle has no regional wall motion abnormalities. Left ventricular diastolic parameters were normal.  2. Right ventricular systolic function is normal. The right ventricular size is normal. There is normal pulmonary artery systolic pressure.  3. The mitral valve is normal in structure. Trivial mitral valve regurgitation. No evidence of mitral stenosis.  4. The aortic valve is normal in structure. Aortic valve regurgitation is not  visualized. No aortic stenosis is present.  5. The inferior vena cava is normal in size with greater than 50% respiratory variability, suggesting right atrial pressure of 3 mmHg. FINDINGS  Left Ventricle: Left ventricular ejection fraction, by estimation, is 55 to 60%. The left ventricle has normal function. The left ventricle has no regional wall motion abnormalities. The left ventricular internal cavity size was normal in size. There is  borderline concentric left ventricular hypertrophy. Left ventricular diastolic parameters were normal. Normal left ventricular filling pressure. Right Ventricle: The right ventricular size is normal. No increase in right ventricular wall thickness. Right ventricular systolic function is normal. There is normal pulmonary artery systolic pressure. The tricuspid regurgitant velocity is 1.89 m/s, and  with an assumed right atrial pressure of 3 mmHg, the estimated right ventricular systolic pressure is [id number]. Left Atrium: Left atrial size was normal in size. Right Atrium: Right atrial size was normal in  size. Pericardium: There is no evidence of pericardial effusion. Mitral Valve: The mitral valve is normal in structure. Trivial mitral valve regurgitation. No evidence of mitral valve stenosis. Tricuspid Valve: The tricuspid valve is normal in structure. Tricuspid valve regurgitation is not demonstrated. No evidence of tricuspid stenosis. Aortic Valve: The aortic valve is normal in structure. Aortic valve regurgitation is not visualized. No aortic stenosis is present. Pulmonic Valve: The pulmonic valve was normal in structure. Pulmonic valve regurgitation is not visualized. No evidence of pulmonic stenosis. Aorta: The aortic root is normal in size and structure. Venous: The inferior vena cava is normal in size with greater than 50% respiratory variability, suggesting right atrial pressure of 3 mmHg. IAS/Shunts: No atrial level shunt detected by color flow Doppler. LEFT VENTRICLE  PLAX 2D LVIDd:         5.28 cm      Diastology LVIDs:         3.70 cm      LV e' medial:    6.41 cm/s LV PW:         1.10 cm      LV E/e' medial:  6.5 LV IVS:        1.10 cm      LV e' lateral:   8.51 cm/s LVOT diam:     2.40 cm      LV E/e' lateral: 4.9 LV SV:         79 LV SV Index:   33 LVOT Area:     4.52 cm  LV Volumes (MOD) LV vol d, MOD A2C: 197.0 ml LV vol d, MOD A4C: 232.0 ml LV vol s, MOD A2C: 81.4 ml LV vol s, MOD A4C: 111.0 ml LV SV MOD A2C:     115.6 ml LV SV MOD A4C:     232.0 ml LV SV MOD BP:      120.7 ml RIGHT VENTRICLE RV S prime:     15.50 cm/s TAPSE (M-mode): 3.2 cm LEFT ATRIUM         Index LA diam:    3.60 cm 1.48 cm/m  AORTIC VALVE LVOT Vmax:   91.20 cm/s LVOT Vmean:  65.900 cm/s LVOT VTI:    0.175 m  AORTA Ao Root diam: 3.60 cm MITRAL VALVE               TRICUSPID VALVE MV Area (PHT): 2.99 cm    TR Peak grad:   14.3 mmHg MV Decel Time: 254 msec    TR Vmax:        189.00 cm/s MV E velocity: 41.90 cm/s MV A velocity: 36.10 cm/s  SHUNTS MV E/A ratio:  1.16        Systemic VTI:  0.18 m                            Systemic Diam: 2.40 cm Dani Gobble Croitoru MD Electronically signed by Sanda Klein MD Signature Date/Time: 08/31/2020/3:07:43 PM    Final

## 2020-09-06 NOTE — Progress Notes (Signed)
Occupational Therapy Treatment Patient Details Name: Todd [name] MRN: [id number] DOB: 1965-11-21 Today's Date: 09/06/2020    History of present illness 55 y.o. male with medical history significant of HTN, morbid obesity status post gastric bypass in 2018, arthritis, presented with new onset of shortness of breath.  COVID + (unvaccinated); dehydration   OT comments  Pt seated EOB upon arrival pleasant and very motivated to participate in therapy session. Pt performing x6 laps in room, x2 sets (while managing his own O2 tank). Pt initially completing activity on 10L O2 with second set completed on 8L O2. SpO2 fluctuating (difficulty to obtain continuous good wave pleth) but with lowest (accurate) O2 noted 80-81%. Pt tending to rebound with seated rest within 1-2 min to 88-90% and greater. Pt with no significant dyspnea noted with activity. He is highly motivated to return to his PLOF, though does require some cueing/education on current deficits as pt at one point asking to try walking in room on no O2. Anticipate continued, steady progress. Will continue per POC.    Follow Up Recommendations  No OT follow up    Equipment Recommendations  Tub/shower seat (vs 3:1 as shower seat)          Precautions / Restrictions Precautions Precautions: Fall Restrictions Weight Bearing Restrictions: No       Mobility Bed Mobility               General bed mobility comments: pt seated EOB upon arrival  Transfers Overall transfer level: Needs assistance Equipment used: None Transfers: Sit to/from Stand Sit to Stand: Supervision         General transfer comment: gross supervision throughout for line management only. multiple sit<>stand durng session    Balance                                           ADL either performed or assessed with clinical judgement   ADL Overall ADL's : Needs assistance/impaired                                        General ADL Comments: focus on therapeutic exercise/mobility today for continued strengthening and endurance                       Cognition Arousal/Alertness: Awake/alert Behavior During Therapy: WFL for tasks assessed/performed Overall Cognitive Status: Within Functional Limits for tasks assessed                                 General Comments: very motivated        Exercises Exercises: Other exercises;General Upper Extremity General Exercises - Upper Extremity Shoulder Flexion: AROM;Both;10 reps;Seated;Theraband Theraband Level (Shoulder Flexion): Level 3 (Green) Shoulder Horizontal ABduction: AROM;Both;10 reps;Seated;Theraband Theraband Level (Shoulder Horizontal Abduction): Level 3 (Green) Shoulder Horizontal ADduction: AROM;Both;10 reps;Seated;Theraband Theraband Level (Shoulder Horizontal Adduction): Level 3 (Green) Elbow Flexion: AROM;Both;10 reps;Seated;Theraband Theraband Level (Elbow Flexion): Level 3 (Green) Elbow Extension: AROM;Both;10 reps;Seated;Theraband Theraband Level (Elbow Extension): Level 3 (Green) Other Exercises Other Exercises: x10 sit<>stand from EOB   Shoulder Instructions       General Comments      Pertinent Vitals/ Pain          Home  Living                                          Prior Functioning/Environment              Frequency  Min 3X/week        Progress Toward Goals  OT Goals(current goals can now be found in the care plan section)  Progress towards OT goals: Progressing toward goals  Acute Rehab OT Goals Patient Stated Goal: return home ASAP OT Goal Formulation: With patient Time For Goal Achievement: 09/17/20 Potential to Achieve Goals: Good ADL Goals Pt/caregiver will Perform Home Exercise Program: Increased strength;Both right and left upper extremity;With theraband;With written HEP provided Additional ADL Goal #1: Pt will recall at least 3 energy conservation skills  to apply to ADL activity Additional ADL Goal #2: Pt will self monitor and maintain SpO2 >88% during ADL activity  Plan Discharge plan remains appropriate;Frequency remains appropriate    Co-evaluation                 AM-PAC OT "6 Clicks" Daily Activity     Outcome Measure   Help from another person eating meals?: None Help from another person taking care of personal grooming?: None Help from another person toileting, which includes using toliet, bedpan, or urinal?: A Little Help from another person bathing (including washing, rinsing, drying)?: A Little Help from another person to put on and taking off regular upper body clothing?: None Help from another person to put on and taking off regular lower body clothing?: A Little 6 Click Score: 21    End of Session Equipment Utilized During Treatment: Oxygen  OT Visit Diagnosis: Other abnormalities of gait and mobility (R26.89)   Activity Tolerance Patient tolerated treatment well   Patient Left with call bell/phone within reach;Other (comment) (seated EOB)   Nurse Communication Mobility status        Time: 7616-0737 OT Time Calculation (min): 39 min  Charges: OT General Charges $OT Visit: 1 Visit OT Treatments $Therapeutic Activity: 38-52 mins  Marcy Siren, OT Acute Rehabilitation Services Pager [phone] Office [phone]    Todd [name] 09/06/2020, 3:16 PM

## 2020-09-06 NOTE — Progress Notes (Signed)
Physical Therapy Treatment Patient Details Name: Todd [name] MRN: [id number] DOB: 08/29/1966 Today's Date: 09/06/2020    History of Present Illness 55 y.o. male with medical history significant of HTN, morbid obesity status post gastric bypass in 2018, arthritis, presented with new onset of shortness of breath.  COVID + (unvaccinated); dehydration    PT Comments    Pt making steady progress with activity tolerance and decr in supplemental O2.    Follow Up Recommendations  No PT follow up     Equipment Recommendations  None recommended by PT    Recommendations for Other Services       Precautions / Restrictions Precautions Precautions: None Restrictions Weight Bearing Restrictions: No    Mobility  Bed Mobility               General bed mobility comments: Pt up in chair  Transfers Overall transfer level: Modified independent Equipment used: None Transfers: Sit to/from Stand Sit to Stand: Modified independent (Device/Increase time)         General transfer comment: gross supervision throughout for line management only. multiple sit<>stand durng session  Ambulation/Gait Ambulation/Gait assistance: Supervision Gait Distance (Feet): 350 Feet (350' x 1, 120' x 1) Assistive device: None Gait Pattern/deviations: Step-through pattern;Wide base of support Gait velocity: adequate Gait velocity interpretation: >2.62 ft/sec, indicative of community ambulatory General Gait Details: supervision for safety and to monitor vitals   Stairs             Wheelchair Mobility    Modified Rankin (Stroke Patients Only)       Balance Overall balance assessment: No apparent balance deficits (not formally assessed)                                          Cognition Arousal/Alertness: Awake/alert Behavior During Therapy: WFL for tasks assessed/performed Overall Cognitive Status: Within Functional Limits for tasks assessed                                  General Comments: very motivated      Exercises General Exercises - Upper Extremity Shoulder Flexion: AROM;Both;10 reps;Seated;Theraband Theraband Level (Shoulder Flexion): Level 3 (Green) Shoulder Horizontal ABduction: AROM;Both;10 reps;Seated;Theraband Theraband Level (Shoulder Horizontal Abduction): Level 3 (Green) Shoulder Horizontal ADduction: AROM;Both;10 reps;Seated;Theraband Theraband Level (Shoulder Horizontal Adduction): Level 3 (Green) Elbow Flexion: AROM;Both;10 reps;Seated;Theraband Theraband Level (Elbow Flexion): Level 3 (Green) Elbow Extension: AROM;Both;10 reps;Seated;Theraband Theraband Level (Elbow Extension): Level 3 (Green) Other Exercises Other Exercises: x10 sit<>stand from EOB    General Comments General comments (skin integrity, edema, etc.): Pt on 10L HFNC at rest with SpO2 94%. Amb in room on 8L O2 with SpO2 >88%. Decr SpO2 to 6L to amb in hallway with SpO2 to 85-86% at it's lowest. On return to room turned O2 back up to 8L with SpO2 recovering to 90% in 1 minute      Pertinent Vitals/Pain      Home Living                      Prior Function            PT Goals (current goals can now be found in the care plan section) Acute Rehab PT Goals Patient Stated Goal: return home ASAP Progress towards PT goals: Progressing toward goals  Frequency    Min 3X/week      PT Plan Current plan remains appropriate    Co-evaluation              AM-PAC PT "6 Clicks" Mobility   Outcome Measure  Help needed turning from your back to your side while in a flat bed without using bedrails?: None Help needed moving from lying on your back to sitting on the side of a flat bed without using bedrails?: None Help needed moving to and from a bed to a chair (including a wheelchair)?: None Help needed standing up from a chair using your arms (e.g., wheelchair or bedside chair)?: None Help needed to walk in hospital room?: A  Little Help needed climbing 3-5 steps with a railing? : A Little 6 Click Score: 22    End of Session Equipment Utilized During Treatment: Oxygen Activity Tolerance: Patient tolerated treatment well Patient left: in chair;with call bell/phone within reach Nurse Communication: Mobility status;Other (comment) (Left O2 on 8L HFNC) PT Visit Diagnosis: Difficulty in walking, not elsewhere classified (R26.2)     Time: 9323-5573 PT Time Calculation (min) (ACUTE ONLY): 19 min  Charges:  $Gait Training: 8-22 mins                     [facility] PT Acute Rehabilitation Services Pager [phone] Office [phone]    Angelina Ok [facility] 09/06/2020, 6:26 PM

## 2020-09-07 LAB — COMPREHENSIVE METABOLIC PANEL
ALT: 58 U/L — ABNORMAL HIGH (ref 0–44)
AST: 27 U/L (ref 15–41)
Albumin: 2.3 g/dL — ABNORMAL LOW (ref 3.5–5.0)
Alkaline Phosphatase: 35 U/L — ABNORMAL LOW (ref 38–126)
Anion gap: 8 (ref 5–15)
BUN: 26 mg/dL — ABNORMAL HIGH (ref 6–20)
CO2: 24 mmol/L (ref 22–32)
Calcium: 8 mg/dL — ABNORMAL LOW (ref 8.9–10.3)
Chloride: 103 mmol/L (ref 98–111)
Creatinine, Ser: 0.9 mg/dL (ref 0.61–1.24)
GFR, Estimated: >60 mL/min (ref >60)
Glucose, Bld: 126 mg/dL — ABNORMAL HIGH (ref 70–99)
Potassium: 4 mmol/L (ref 3.5–5.1)
Sodium: 135 mmol/L (ref 135–145)
Total Bilirubin: 1.1 mg/dL (ref 0.3–1.2)
Total Protein: 4.9 g/dL — ABNORMAL LOW (ref 6.5–8.1)

## 2020-09-07 LAB — GLUCOSE, CAPILLARY
Glucose-Capillary: 83 mg/dL (ref 70–99)
Glucose-Capillary: 88 mg/dL (ref 70–99)

## 2020-09-07 LAB — CBC
HCT: 38.9 % — ABNORMAL LOW (ref 39.0–52.0)
Hemoglobin: 13 g/dL (ref 13.0–17.0)
MCH: 27.4 pg (ref 26.0–34.0)
MCHC: 33.4 g/dL (ref 30.0–36.0)
MCV: 81.9 fL (ref 80.0–100.0)
Platelets: 203 10*3/uL (ref 150–400)
RBC: 4.75 MIL/uL (ref 4.22–5.81)
RDW: 13.5 % (ref 11.5–15.5)
WBC: 7.9 10*3/uL (ref 4.0–10.5)
nRBC: 0 % (ref 0.0–0.2)

## 2020-09-07 LAB — D-DIMER, QUANTITATIVE: D-Dimer, Quant: 1.09 ug/mL-FEU — ABNORMAL HIGH (ref 0.00–0.50)

## 2020-09-07 MED ORDER — DEXAMETHASONE 6 MG PO TABS: 6.0000 mg | ORAL_TABLET | Freq: Every day | ORAL | 0 refills | Status: DC

## 2020-09-07 MED ORDER — ACETAMINOPHEN 325 MG PO TABS: 650.0000 mg | ORAL_TABLET | Freq: Four times a day (QID) | ORAL | Status: DC | PRN

## 2020-09-07 MED ORDER — IPRATROPIUM-ALBUTEROL 20-100 MCG/ACT IN AERS
1.0000 | INHALATION_SPRAY | Freq: Four times a day (QID) | RESPIRATORY_TRACT | Status: DC | PRN
  Administered 2020-09-07: 1 via RESPIRATORY_TRACT
  Filled 2020-09-07: qty 4

## 2020-09-07 NOTE — Plan of Care (Signed)

## 2020-09-07 NOTE — Discharge Instructions (Signed)
Person Under Monitoring Name: Todd [name]  Location: [address] [place] [address]   Infection Prevention Recommendations for Individuals Confirmed to have, or Being Evaluated for, 2019 Novel Coronavirus (COVID-19) Infection Who Receive Care at Home  Individuals who are confirmed to have, or are being evaluated for, COVID-19 should follow the prevention steps below until a healthcare provider or local or state health department says they can return to normal activities.  Stay home except to get medical care You should restrict activities outside your home, except for getting medical care. Do not go to work, school, or public areas, and do not use public transportation or taxis.  Call ahead before visiting your doctor Before your medical appointment, call the healthcare provider and tell them that you have, or are being evaluated for, COVID-19 infection. This will help the healthcare provider's office take steps to keep other people from getting infected. Ask your healthcare provider to call the local or state health department.  Monitor your symptoms Seek prompt medical attention if your illness is worsening (e.g., difficulty breathing). Before going to your medical appointment, call the healthcare provider and tell them that you have, or are being evaluated for, COVID-19 infection. Ask your healthcare provider to call the local or state health department.  Wear a facemask You should wear a facemask that covers your nose and mouth when you are in the same room with other people and when you visit a healthcare provider. People who live with or visit you should also wear a facemask while they are in the same room with you.  Separate yourself from other people in your home As much as possible, you should stay in a different room from other people in your home. Also, you should use a separate bathroom, if available.  Avoid sharing household items You should not  share dishes, drinking glasses, cups, eating utensils, towels, bedding, or other items with other people in your home. After using these items, you should wash them thoroughly with soap and water.  Cover your coughs and sneezes Cover your mouth and nose with a tissue when you cough or sneeze, or you can cough or sneeze into your sleeve. Throw used tissues in a lined trash can, and immediately wash your hands with soap and water for at least 20 seconds or use an alcohol-based hand rub.  Wash your [employer] your hands often and thoroughly with soap and water for at least 20 seconds. You can use an alcohol-based hand sanitizer if soap and water are not available and if your hands are not visibly dirty. Avoid touching your eyes, nose, and mouth with unwashed hands.   Prevention Steps for Caregivers and Household Members of Individuals Confirmed to have, or Being Evaluated for, COVID-19 Infection Being Cared for in the Home  If you live with, or provide care at home for, a person confirmed to have, or being evaluated for, COVID-19 infection please follow these guidelines to prevent infection:  Follow healthcare provider's instructions Make sure that you understand and can help the patient follow any healthcare provider instructions for all care.  Provide for the patient's basic needs You should help the patient with basic needs in the home and provide support for getting groceries, prescriptions, and other personal needs.  Monitor the patient's symptoms If they are getting sicker, call his or her medical provider and tell them that the patient has, or is being evaluated for, COVID-19 infection. This will help the healthcare provider's office  take steps to keep other people from getting infected. Ask the healthcare provider to call the local or state health department.  Limit the number of people who have contact with the patient  If possible, have only one caregiver for the  patient.  Other household members should stay in another home or place of residence. If this is not possible, they should stay  in another room, or be separated from the patient as much as possible. Use a separate bathroom, if available.  Restrict visitors who do not have an essential need to be in the home.  Keep older adults, very young children, and other sick people away from the patient Keep older adults, very young children, and those who have compromised immune systems or chronic health conditions away from the patient. This includes people with chronic heart, lung, or kidney conditions, diabetes, and cancer.  Ensure good ventilation Make sure that shared spaces in the home have good air flow, such as from an air conditioner or an opened window, weather permitting.  Wash your hands often  Wash your hands often and thoroughly with soap and water for at least 20 seconds. You can use an alcohol based hand sanitizer if soap and water are not available and if your hands are not visibly dirty.  Avoid touching your eyes, nose, and mouth with unwashed hands.  Use disposable paper towels to dry your hands. If not available, use dedicated cloth towels and replace them when they become wet.  Wear a facemask and gloves  Wear a disposable facemask at all times in the room and gloves when you touch or have contact with the patient's blood, body fluids, and/or secretions or excretions, such as sweat, saliva, sputum, nasal mucus, vomit, urine, or feces.  Ensure the mask fits over your nose and mouth tightly, and do not touch it during use.  Throw out disposable facemasks and gloves after using them. Do not reuse.  Wash your hands immediately after removing your facemask and gloves.  If your personal clothing becomes contaminated, carefully remove clothing and launder. Wash your hands after handling contaminated clothing.  Place all used disposable facemasks, gloves, and other waste in a lined  container before disposing them with other household waste.  Remove gloves and wash your hands immediately after handling these items.  Do not share dishes, glasses, or other household items with the patient  Avoid sharing household items. You should not share dishes, drinking glasses, cups, eating utensils, towels, bedding, or other items with a patient who is confirmed to have, or being evaluated for, COVID-19 infection.  After the person uses these items, you should wash them thoroughly with soap and water.  Wash laundry thoroughly  Immediately remove and wash clothes or bedding that have blood, body fluids, and/or secretions or excretions, such as sweat, saliva, sputum, nasal mucus, vomit, urine, or feces, on them.  Wear gloves when handling laundry from the patient.  Read and follow directions on labels of laundry or clothing items and detergent. In general, wash and dry with the warmest temperatures recommended on the label.  Clean all areas the individual has used often  Clean all touchable surfaces, such as counters, tabletops, doorknobs, bathroom fixtures, toilets, phones, keyboards, tablets, and bedside tables, every day. Also, clean any surfaces that may have blood, body fluids, and/or secretions or excretions on them.  Wear gloves when cleaning surfaces the patient has come in contact with.  Use a diluted bleach solution (e.g., dilute bleach with 1 part  bleach and 10 parts water) or a household disinfectant with a label that says EPA-registered for coronaviruses. To make a bleach solution at home, add 1 tablespoon of bleach to 1 quart (4 cups) of water. For a larger supply, add  cup of bleach to 1 gallon (16 cups) of water.  Read labels of cleaning products and follow recommendations provided on product labels. Labels contain instructions for safe and effective use of the cleaning product including precautions you should take when applying the product, such as wearing gloves or  eye protection and making sure you have good ventilation during use of the product.  Remove gloves and wash hands immediately after cleaning.  Monitor yourself for signs and symptoms of illness Caregivers and household members are considered close contacts, should monitor their health, and will be asked to limit movement outside of the home to the extent possible. Follow the monitoring steps for close contacts listed on the symptom monitoring form.   ? If you have additional questions, contact your local health department or call the epidemiologist on call at [phone] (available 24/7). ? This guidance is subject to change. For the most up-to-date guidance from [facility], please refer to their website: [email or web address].pl

## 2020-09-07 NOTE — Progress Notes (Signed)
SATURATION QUALIFICATIONS: (This note is used to comply with regulatory documentation for home oxygen)  Patient Saturations on Room Air at Rest = 85%  Patient Saturations on Room Air while Ambulating = NA  Patient Saturations on 4 Liters of oxygen while Ambulating = 88%  Please briefly explain why patient needs home oxygen: oxygen saturation <88% on room air and patient requires 4LO2 to maintain at saturation >88% during ambulation.

## 2020-09-07 NOTE — TOC Transition Note (Signed)
Transition of Care [facility]) - CM/SW Discharge Note   Patient Details  Name: Todd [name] MRN: [id number] Date of Birth: Aug 28, 1966  [facility] CM/SW Contact:  [name], RN Phone Number: 09/07/2020, 1:55 PM   Clinical Narrative:    Pt discharging home with self care. Pt did qualify for home oxygen. CM spoke to the patient over the phone and he had no preference for DME company. CM arranged the oxygen through [employer]. Oxygen for transport home will be delivered to the pts room and then home oxygen supplies will later be delivered to the patients home.  Pt has transport home when ready.   Final next level of care: Home/Self Care Barriers to Discharge: No Barriers Identified   Patient Goals and CMS Choice     Choice offered to / list presented to : Patient  Discharge Placement                       Discharge Plan and Services   Discharge Planning Services: CM Consult            DME Arranged: Oxygen DME Agency: [employer] Date DME Agency Contacted: 09/07/20   Representative spoke with at DME Agency: Duwayne Heck            Social Determinants of Health (SDOH) Interventions     Readmission Risk Interventions No flowsheet data found.

## 2020-09-07 NOTE — Discharge Summary (Signed)
Todd [name], is a 55 y.o. male  DOB [birth year]  MRN [id number].  Admission date:  08/30/2020  Admitting Physician  Lequita Halt, MD  Discharge Date:  09/07/2020   Primary MD  Seward Carol, MD  Recommendations for primary care physician for things to follow:  -Check CBC, CMP during next visit   Admission Diagnosis  Hypoxia [R09.02] Pneumonia due to COVID-19 virus [U07.1, J12.82] COVID-19 [U07.1]   Discharge Diagnosis  Hypoxia [R09.02] Pneumonia due to COVID-19 virus [U07.1, J12.82] COVID-19 [U07.1]    Active Problems:   COVID-19 virus infection   Pneumonia due to COVID-19 virus      Past Medical History:  Diagnosis Date  . Arthritis   . Hypertension   . Obesity     Past Surgical History:  Procedure Laterality Date  . COLONOSCOPY WITH PROPOFOL N/A 08/07/2017   Procedure: COLONOSCOPY WITH PROPOFOL;  Surgeon: Wilford Corner, MD;  Location: [facility];  Service: Endoscopy;  Laterality: N/A;  . FOOT SURGERY    . LEG SURGERY         History of present illness and  Hospital Course:     Kindly see H&P for history of present illness and admission details, please review complete Labs, Consult reports and Test reports for all details in brief  HPI  from the history and physical done on the day of admission   HPI: Todd [name] is a 55 y.o. male with medical history significant of HTN, morbid obesity status post gastric bypass in 2018, arthritis, presented with new onset of shortness of breath.  Symptoms started 5 days ago, initially was dry cough then started to produce more whitish sputum, spiked fever for 3 days with T-max 101, fever subsided 2 days ago.  No loss of taste or diarrhea but complaining about feeling extremely tired.  EMS arrived and found patient hypoxic with oxygenation 82% on room air.  Patient not vaccinated for COVID for personal reasons. ED Course: Oxygenation  stabilized on 4 L, Covid positive, chest x-ray showed bilateral multifocal infiltrates.  Hospital Course   Acute Hypoxic Resp. Failure due to Acute Covid 19 Viral Pneumonitis during the ongoing 2020 Covid 19 Pandemic  - he is unfortunately not vaccinated  - he  has incurred severe parenchymal lung injury due to COVID-19 pneumonia causing ARDS.  He is with severe hypoxia, requiring 35 L heated high flow nasal cannula  100%, he was treated with Actemra on admission, received IV steroids, and IV Remdesivir, with good response, with gradual improvement of his hypoxia, today he is requiring 4 L with activity, and with rest as well, he will be discharged on home oxygen, he will be discharged on oral Decadron as well for another 5 days as an outpatient, he was encouraged to take his incentive spirometry, flutter valve and keep using it at home. - dimer is elevated, venous Dopplers is negative for DVT, it is trending down .   Severe dehydration.   - Resolved.  HX of gastric bypass.   -  Supportive care.  GERD.   - PPI.  Hypertension.   - Blood pressure well controlled.    Hypoxia induced mild bump in troponin, chest pain-free, EKG nonacute. echo shows normal ejection fraction, no WMA.  Epistaxis: Brief episode on 09/01/2020.  No further episodes.  Monitor closely.    Discharge Condition:  stable       Discharge Instructions  and  Discharge Medications     Discharge Instructions    Diet - low sodium heart healthy   Complete by: As directed    Discharge instructions   Complete by: As directed    Follow with Primary MD Seward Carol, MD in 14 days   Get CBC, CMP, 2 view Chest X ray checked  by Primary MD next visit.    Activity: As tolerated with Full fall precautions use walker/cane & assistance as needed   Disposition Home    Diet: Heart Healthy .   On your next visit with your primary care physician please Get Medicines reviewed and adjusted.   Please  request your Prim.MD to go over all Hospital Tests and Procedure/Radiological results at the follow up, please get all Hospital records sent to your Prim MD by signing hospital release before you go home.   If you experience worsening of your admission symptoms, develop shortness of breath, life threatening emergency, suicidal or homicidal thoughts you must seek medical attention immediately by calling 911 or calling your MD immediately  if symptoms less severe.  You Must read complete instructions/literature along with all the possible adverse reactions/side effects for all the Medicines you take and that have been prescribed to you. Take any new Medicines after you have completely understood and accpet all the possible adverse reactions/side effects.   Do not drive, operating heavy machinery, perform activities at heights, swimming or participation in water activities or provide baby sitting services if your were admitted for syncope or siezures until you have seen by Primary MD or a Neurologist and advised to do so again.  Do not drive when taking Pain medications.    Do not take more than prescribed Pain, Sleep and Anxiety Medications  Special Instructions: If you have smoked or chewed Tobacco  in the last 2 yrs please stop smoking, stop any regular Alcohol  and or any Recreational drug use.  Wear Seat belts while driving.   Please note  You were cared for by a hospitalist during your hospital stay. If you have any questions about your discharge medications or the care you received while you were in the hospital after you are discharged, you can call the unit and asked to speak with the hospitalist on call if the hospitalist that took care of you is not available. Once you are discharged, your primary care physician will handle any further medical issues. Please note that NO REFILLS for any discharge medications will be authorized once you are discharged, as it is imperative that you return  to your primary care physician (or establish a relationship with a primary care physician if you do not have one) for your aftercare needs so that they can reassess your need for medications and monitor your lab values.   Increase activity slowly   Complete by: As directed      Allergies as of 09/07/2020      Reactions   Amoxil [amoxicillin] Hives, Shortness Of Breath   Has patient had a PCN reaction causing immediate rash, facial/tongue/throat swelling, SOB or lightheadedness with hypotension: Yes Has patient had  a PCN reaction causing severe rash involving mucus membranes or skin necrosis: Yes Has patient had a PCN reaction that required hospitalization: No Has patient had a PCN reaction occurring within the last 10 years: Yes If all of the above answers are "NO", then may proceed with Cephalosporin use.   Tylox [oxycodone-acetaminophen] Rash      Medication List    STOP taking these medications   naproxen sodium 220 MG tablet Commonly known as: ALEVE     TAKE these medications   acetaminophen 325 MG tablet Commonly known as: TYLENOL Take 2 tablets (650 mg total) by mouth every 6 (six) hours as needed for mild pain or headache (fever >/= 101).   cholecalciferol 25 MCG (1000 UNIT) tablet Commonly known as: VITAMIN D3 Take 1,000 Units by mouth daily.   dexamethasone 6 MG tablet Commonly known as: DECADRON Take 1 tablet (6 mg total) by mouth daily. Start taking on: September 08, 2020   esomeprazole 20 MG capsule Commonly known as: NEXIUM Take 20 mg by mouth daily as needed (acid reflux).   hydrochlorothiazide 25 MG tablet Commonly known as: HYDRODIURIL Take 25 mg by mouth daily as needed (blood pressure).   OVER THE COUNTER MEDICATION Take 4,000 mg by mouth daily. Black seed oil otc supplement   simethicone 125 MG chewable tablet Commonly known as: MYLICON Chew 0000000 mg by mouth at bedtime as needed for flatulence.   Turmeric 500 MG Caps Take 500 mg by mouth daily.    vitamin C with rose hips 500 MG tablet Take 1,000 mg by mouth daily.   zinc gluconate 50 MG tablet Take 50 mg by mouth daily.            Durable Medical Equipment  (From admission, onward)         Start     Ordered   09/07/20 1347  For home use only DME oxygen  Once       Question Answer Comment  Length of Need 6 Months   Mode or (Route) Nasal cannula   Liters per Minute 4   Frequency Continuous (stationary and portable oxygen unit needed)   Oxygen conserving device Yes   Oxygen delivery system Gas      09/07/20 1346            Diet and Activity recommendation: See Discharge Instructions above   Consults obtained -  None   Major procedures and Radiology Reports - PLEASE review detailed and final reports for all details, in brief -     DG Chest Portable 1 View  Result Date: 08/30/2020 CLINICAL DATA:  55 year old male with hypoxia. Positive COVID-19. EXAM: PORTABLE CHEST 1 VIEW COMPARISON:  Chest radiograph dated 11/18/2010. FINDINGS: There is cardiomegaly with vascular congestion. Bilateral mid to lower lung field predominant hazy opacities consistent with multifocal pneumonia and in keeping with COVID-19. Clinical correlation is recommended. There is no pleural effusion pneumothorax. No acute osseous pathology. IMPRESSION: 1. Multifocal pneumonia. 2. Cardiomegaly with vascular congestion. Electronically Signed   By: Anner Crete M.D.   On: 08/30/2020 16:09   VAS [place] LOWER EXTREMITY VENOUS (DVT)  Result Date: 09/06/2020  Lower Venous DVT Study Other Indications: Covid+ elevated D-dimer. Comparison Study: Prior study available. Performing Technologist: Rogelia Rohrer  Examination Guidelines: A complete evaluation includes B-mode imaging, spectral Doppler, color Doppler, and power Doppler as needed of all accessible portions of each vessel. Bilateral testing is considered an integral part of a complete examination. Limited examinations for reoccurring indications may  be performed as noted. The reflux portion of the exam is performed with the patient in reverse Trendelenburg.  +---------+---------------+---------+-----------+----------+--------------+ RIGHT    CompressibilityPhasicitySpontaneityPropertiesThrombus Aging +---------+---------------+---------+-----------+----------+--------------+ CFV      Full           Yes      Yes                                 +---------+---------------+---------+-----------+----------+--------------+ SFJ      Full                                                        +---------+---------------+---------+-----------+----------+--------------+ FV Prox  Full           Yes      Yes                                 +---------+---------------+---------+-----------+----------+--------------+ FV Mid   Full           Yes      Yes                                 +---------+---------------+---------+-----------+----------+--------------+ FV DistalFull           Yes      Yes                                 +---------+---------------+---------+-----------+----------+--------------+ PFV      Full                                                        +---------+---------------+---------+-----------+----------+--------------+ POP      Full           Yes      Yes                                 +---------+---------------+---------+-----------+----------+--------------+ PTV      Full                                                        +---------+---------------+---------+-----------+----------+--------------+ PERO     Full                                                        +---------+---------------+---------+-----------+----------+--------------+   +---------+---------------+---------+-----------+----------+--------------+ LEFT     CompressibilityPhasicitySpontaneityPropertiesThrombus Aging +---------+---------------+---------+-----------+----------+--------------+ CFV       Full           Yes      Yes                                 +---------+---------------+---------+-----------+----------+--------------+  SFJ      Full                                                        +---------+---------------+---------+-----------+----------+--------------+ FV Prox  Full           Yes      Yes                                 +---------+---------------+---------+-----------+----------+--------------+ FV Mid   Full           Yes      Yes                                 +---------+---------------+---------+-----------+----------+--------------+ FV DistalFull           Yes      Yes                                 +---------+---------------+---------+-----------+----------+--------------+ PFV      Full                                                        +---------+---------------+---------+-----------+----------+--------------+ POP      Full           Yes      Yes                                 +---------+---------------+---------+-----------+----------+--------------+ PTV      Full                                                        +---------+---------------+---------+-----------+----------+--------------+ PERO     Full                                                        +---------+---------------+---------+-----------+----------+--------------+     Summary: RIGHT: - There is no evidence of deep vein thrombosis in the lower extremity.  - No cystic structure found in the popliteal fossa.  LEFT: - There is no evidence of deep vein thrombosis in the lower extremity.  - No cystic structure found in the popliteal fossa.  *See table(s) above for measurements and observations. Electronically signed by Sherald Hess MD on 09/06/2020 at 2:36:49 PM.    Final    ECHOCARDIOGRAM LIMITED  Result Date: 08/31/2020    ECHOCARDIOGRAM LIMITED REPORT   Patient Name:   Todd [name] Date of Exam: 08/31/2020 Medical Rec #:  [id number]        Height:       72.0 in Accession #:  [id number]      Weight:       272.1 lb Date of Birth:  [birth year]       BSA:          2.428 m Patient Age:    26 years        BP:           125/72 mmHg Patient Gender: M               HR:           68 bpm. Exam Location:  Inpatient Procedure: Limited Echo, Cardiac Doppler and Color Doppler Indications:    CHF-Acute Diastolic [id number] / [id number]  History:        Patient has no prior history of Echocardiogram examinations.                 Risk Factors:Hypertension and Non-Smoker.  Sonographer:    Vickie Epley RDCS Referring Phys: [id number] Margaree Mackintosh [facility]  Sonographer Comments: Covid positive. IMPRESSIONS  1. Left ventricular ejection fraction, by estimation, is 55 to 60%. The left ventricle has normal function. The left ventricle has no regional wall motion abnormalities. Left ventricular diastolic parameters were normal.  2. Right ventricular systolic function is normal. The right ventricular size is normal. There is normal pulmonary artery systolic pressure.  3. The mitral valve is normal in structure. Trivial mitral valve regurgitation. No evidence of mitral stenosis.  4. The aortic valve is normal in structure. Aortic valve regurgitation is not visualized. No aortic stenosis is present.  5. The inferior vena cava is normal in size with greater than 50% respiratory variability, suggesting right atrial pressure of 3 mmHg. FINDINGS  Left Ventricle: Left ventricular ejection fraction, by estimation, is 55 to 60%. The left ventricle has normal function. The left ventricle has no regional wall motion abnormalities. The left ventricular internal cavity size was normal in size. There is  borderline concentric left ventricular hypertrophy. Left ventricular diastolic parameters were normal. Normal left ventricular filling pressure. Right Ventricle: The right ventricular size is normal. No increase in right ventricular wall thickness. Right ventricular systolic function is normal. There is  normal pulmonary artery systolic pressure. The tricuspid regurgitant velocity is 1.89 m/s, and  with an assumed right atrial pressure of 3 mmHg, the estimated right ventricular systolic pressure is [id number] mmHg. Left Atrium: Left atrial size was normal in size. Right Atrium: Right atrial size was normal in size. Pericardium: There is no evidence of pericardial effusion. Mitral Valve: The mitral valve is normal in structure. Trivial mitral valve regurgitation. No evidence of mitral valve stenosis. Tricuspid Valve: The tricuspid valve is normal in structure. Tricuspid valve regurgitation is not demonstrated. No evidence of tricuspid stenosis. Aortic Valve: The aortic valve is normal in structure. Aortic valve regurgitation is not visualized. No aortic stenosis is present. Pulmonic Valve: The pulmonic valve was normal in structure. Pulmonic valve regurgitation is not visualized. No evidence of pulmonic stenosis. Aorta: The aortic root is normal in size and structure. Venous: The inferior vena cava is normal in size with greater than 50% respiratory variability, suggesting right atrial pressure of 3 mmHg. IAS/Shunts: No atrial level shunt detected by color flow Doppler. LEFT VENTRICLE PLAX 2D LVIDd:         5.28 cm      Diastology LVIDs:         3.70 cm      LV e' medial:    6.41 cm/s LV PW:  1.10 cm      LV E/e' medial:  6.5 LV IVS:        1.10 cm      LV e' lateral:   8.51 cm/s LVOT diam:     2.40 cm      LV E/e' lateral: 4.9 LV SV:         79 LV SV Index:   33 LVOT Area:     4.52 cm  LV Volumes (MOD) LV vol d, MOD A2C: 197.0 ml LV vol d, MOD A4C: 232.0 ml LV vol s, MOD A2C: 81.4 ml LV vol s, MOD A4C: 111.0 ml LV SV MOD A2C:     115.6 ml LV SV MOD A4C:     232.0 ml LV SV MOD BP:      120.7 ml RIGHT VENTRICLE RV S prime:     15.50 cm/s TAPSE (M-mode): 3.2 cm LEFT ATRIUM         Index LA diam:    3.60 cm 1.48 cm/m  AORTIC VALVE LVOT Vmax:   91.20 cm/s LVOT Vmean:  65.900 cm/s LVOT VTI:    0.175 m  AORTA Ao Root  diam: 3.60 cm MITRAL VALVE               TRICUSPID VALVE MV Area (PHT): 2.99 cm    TR Peak grad:   14.3 mmHg MV Decel Time: 254 msec    TR Vmax:        189.00 cm/s MV E velocity: 41.90 cm/s MV A velocity: 36.10 cm/s  SHUNTS MV E/A ratio:  1.16        Systemic VTI:  0.18 m                            Systemic Diam: 2.40 cm Dani Gobble Croitoru MD Electronically signed by Sanda Klein MD Signature Date/Time: 08/31/2020/3:07:43 PM    Final     Micro Results     Recent Results (from the past 240 hour(s))  Resp Panel by RT-PCR (Flu A&B, Covid) Nasopharyngeal Swab     Status: Abnormal   Collection Time: 08/30/20  1:12 PM   Specimen: Nasopharyngeal Swab; Nasopharyngeal(NP) swabs in vial transport medium  Result Value Ref Range Status   SARS Coronavirus 2 by RT PCR POSITIVE (A) NEGATIVE Final    Comment: emailed L. Berdik RN 14:20 08/30/20 ([email or web address]) (NOTE) SARS-CoV-2 target nucleic acids are DETECTED.  The SARS-CoV-2 RNA is generally detectable in upper respiratory specimens during the acute phase of infection. Positive results are indicative of the presence of the identified virus, but do not rule out bacterial infection or co-infection with other pathogens not detected by the test. Clinical correlation with patient history and other diagnostic information is necessary to determine patient infection status. The expected result is Negative.  Fact Sheet for Patients: EntrepreneurPulse.com.au  Fact Sheet for Healthcare Providers: IncredibleEmployment.be  This test is not yet approved or cleared by the [place] FDA and  has been authorized for detection and/or diagnosis of SARS-CoV-2 by FDA under an Emergency Use Authorization (EUA).  This EUA will remain in effect (meaning this test can be used) for the duration of  the COVID- 19 declaration under Section 564(b)(1) of the Act, 21 U.S.C. section 360bbb-3(b)(1), unless the authorization is terminated or  revoked sooner.     Influenza A by PCR NEGATIVE NEGATIVE Final   Influenza B by PCR NEGATIVE NEGATIVE Final    Comment: (NOTE) The  Xpert Xpress SARS-CoV-2/FLU/RSV plus assay is intended as an aid in the diagnosis of influenza from Nasopharyngeal swab specimens and should not be used as a sole basis for treatment. Nasal washings and aspirates are unacceptable for Xpert Xpress SARS-CoV-2/FLU/RSV testing.  Fact Sheet for Patients: EntrepreneurPulse.com.au  Fact Sheet for Healthcare Providers: IncredibleEmployment.be  This test is not yet approved or cleared by the [place] FDA and has been authorized for detection and/or diagnosis of SARS-CoV-2 by FDA under an Emergency Use Authorization (EUA). This EUA will remain in effect (meaning this test can be used) for the duration of the COVID-19 declaration under Section 564(b)(1) of the Act, 21 U.S.C. section 360bbb-3(b)(1), unless the authorization is terminated or revoked.  Performed at [facility], [place] [address], [place], [place] [address]   Blood Culture (routine x 2)     Status: None   Collection Time: 08/30/20  4:04 PM   Specimen: BLOOD RIGHT ARM  Result Value Ref Range Status   Specimen Description BLOOD RIGHT ARM  Final   Special Requests   Final    BOTTLES DRAWN AEROBIC AND ANAEROBIC Blood Culture adequate volume   Culture   Final    NO GROWTH 5 DAYS Performed at [facility], [place] [address], [place], [place] [address]    Report Status 09/04/2020 FINAL  Final  Blood Culture (routine x 2)     Status: None   Collection Time: 08/31/20  8:15 AM   Specimen: BLOOD LEFT WRIST  Result Value Ref Range Status   Specimen Description BLOOD LEFT WRIST  Final   Special Requests   Final    BOTTLES DRAWN AEROBIC AND ANAEROBIC Blood Culture results may not be optimal due to an inadequate volume of blood received in culture bottles   Culture   Final    NO GROWTH 5 DAYS Performed at  [facility], [place] [address] [place], [place] [address]    Report Status 09/05/2020 FINAL  Final       Today   Subjective:   Jin Bakalar today has no headache,no chest or  abdominal pain,no new weakness tingling or numbness, feels much better wants to go home today.   Objective:   Blood pressure 95/60, pulse 71, temperature 97.6 F (36.4 C), temperature source Oral, resp. rate 16, height 5\' 9"  (1.753 m), weight 119.7 kg, SpO2 92 %.   Intake/Output Summary (Last 24 hours) at 09/07/2020 1352 Last data filed at 09/07/2020 0528 Gross per 24 hour  Intake 240 ml  Output 701 ml  Net -461 ml    Exam Awake Alert, Oriented x 3, No new F.N deficits, Normal affect Symmetrical Chest wall movement, Good air movement bilaterally, CTAB RRR,No Gallops,Rubs or new Murmurs, No Parasternal Heave +ve B.Sounds, Abd Soft, Non tender, No organomegaly appriciated, No rebound -guarding or rigidity. No Cyanosis, Clubbing or edema, No new Rash or bruise  Data Review   CBC w Diff:  Lab Results  Component Value Date   WBC 7.9 09/07/2020   HGB 13.0 09/07/2020   HCT 38.9 (L) 09/07/2020   PLT 203 09/07/2020   LYMPHOPCT 9 09/04/2020   MONOPCT 6 09/04/2020   EOSPCT 0 09/04/2020   BASOPCT 0 09/04/2020    CMP:  Lab Results  Component Value Date   NA 135 09/07/2020   K 4.0 09/07/2020   CL 103 09/07/2020   CO2 24 09/07/2020   BUN 26 (H) 09/07/2020   CREATININE 0.90 09/07/2020   CREATININE 0.92  02/22/2011   PROT 4.9 (L) 09/07/2020   ALBUMIN 2.3 (L) 09/07/2020   BILITOT 1.1 09/07/2020   ALKPHOS 35 (L) 09/07/2020   AST 27 09/07/2020   ALT 58 (H) 09/07/2020  .   Total Time in preparing paper work, data evaluation and todays exam - 59 minutes  Phillips Climes M.D on 09/07/2020 at [facility]   Office  [phone]

## 2020-09-14 ENCOUNTER — Other Ambulatory Visit: Payer: Self-pay | Admitting: Internal Medicine

## 2020-09-14 ENCOUNTER — Ambulatory Visit
Admission: RE | Admit: 2020-09-14 | Discharge: 2020-09-14 | Disposition: A | Discharge status: Home / Self Care | Payer: BC Managed Care – PPO | Source: Ambulatory Visit | Attending: Internal Medicine | Admitting: Internal Medicine

## 2020-09-14 DIAGNOSIS — U071 COVID-19: Secondary | ICD-10-CM | POA: Diagnosis not present

## 2020-09-14 DIAGNOSIS — Z13220 Encounter for screening for lipoid disorders: Secondary | ICD-10-CM | POA: Diagnosis not present

## 2020-09-14 DIAGNOSIS — J181 Lobar pneumonia, unspecified organism: Secondary | ICD-10-CM | POA: Diagnosis not present

## 2020-09-14 DIAGNOSIS — J1282 Pneumonia due to coronavirus disease 2019: Secondary | ICD-10-CM | POA: Diagnosis not present

## 2020-09-14 DIAGNOSIS — R918 Other nonspecific abnormal finding of lung field: Secondary | ICD-10-CM | POA: Diagnosis not present

## 2020-09-14 DIAGNOSIS — Z8616 Personal history of COVID-19: Secondary | ICD-10-CM

## 2020-09-14 DIAGNOSIS — J9601 Acute respiratory failure with hypoxia: Secondary | ICD-10-CM | POA: Diagnosis not present

## 2020-09-29 DIAGNOSIS — R748 Abnormal levels of other serum enzymes: Secondary | ICD-10-CM | POA: Diagnosis not present

## 2020-10-08 DIAGNOSIS — U071 COVID-19: Secondary | ICD-10-CM | POA: Diagnosis not present

## 2021-01-24 DIAGNOSIS — Z86010 Personal history of colonic polyps: Secondary | ICD-10-CM | POA: Diagnosis not present

## 2021-01-24 DIAGNOSIS — K640 First degree hemorrhoids: Secondary | ICD-10-CM | POA: Diagnosis not present

## 2021-01-24 DIAGNOSIS — K5730 Diverticulosis of large intestine without perforation or abscess without bleeding: Secondary | ICD-10-CM | POA: Diagnosis not present

## 2021-08-30 DIAGNOSIS — Z0000 Encounter for general adult medical examination without abnormal findings: Secondary | ICD-10-CM | POA: Diagnosis not present

## 2021-08-30 DIAGNOSIS — Z125 Encounter for screening for malignant neoplasm of prostate: Secondary | ICD-10-CM | POA: Diagnosis not present

## 2021-11-29 DIAGNOSIS — R600 Localized edema: Secondary | ICD-10-CM | POA: Diagnosis not present

## 2022-05-27 IMAGING — DX DG CHEST 1V PORT
1 series · 1 of 1 positions shown · non-contrast
Comparison: Chest radiograph dated 11/18/2010.

CLINICAL DATA: 54-year-old male with hypoxia. Positive 6SQQJ-N2.

EXAM:
PORTABLE CHEST 1 VIEW

[chest ap]
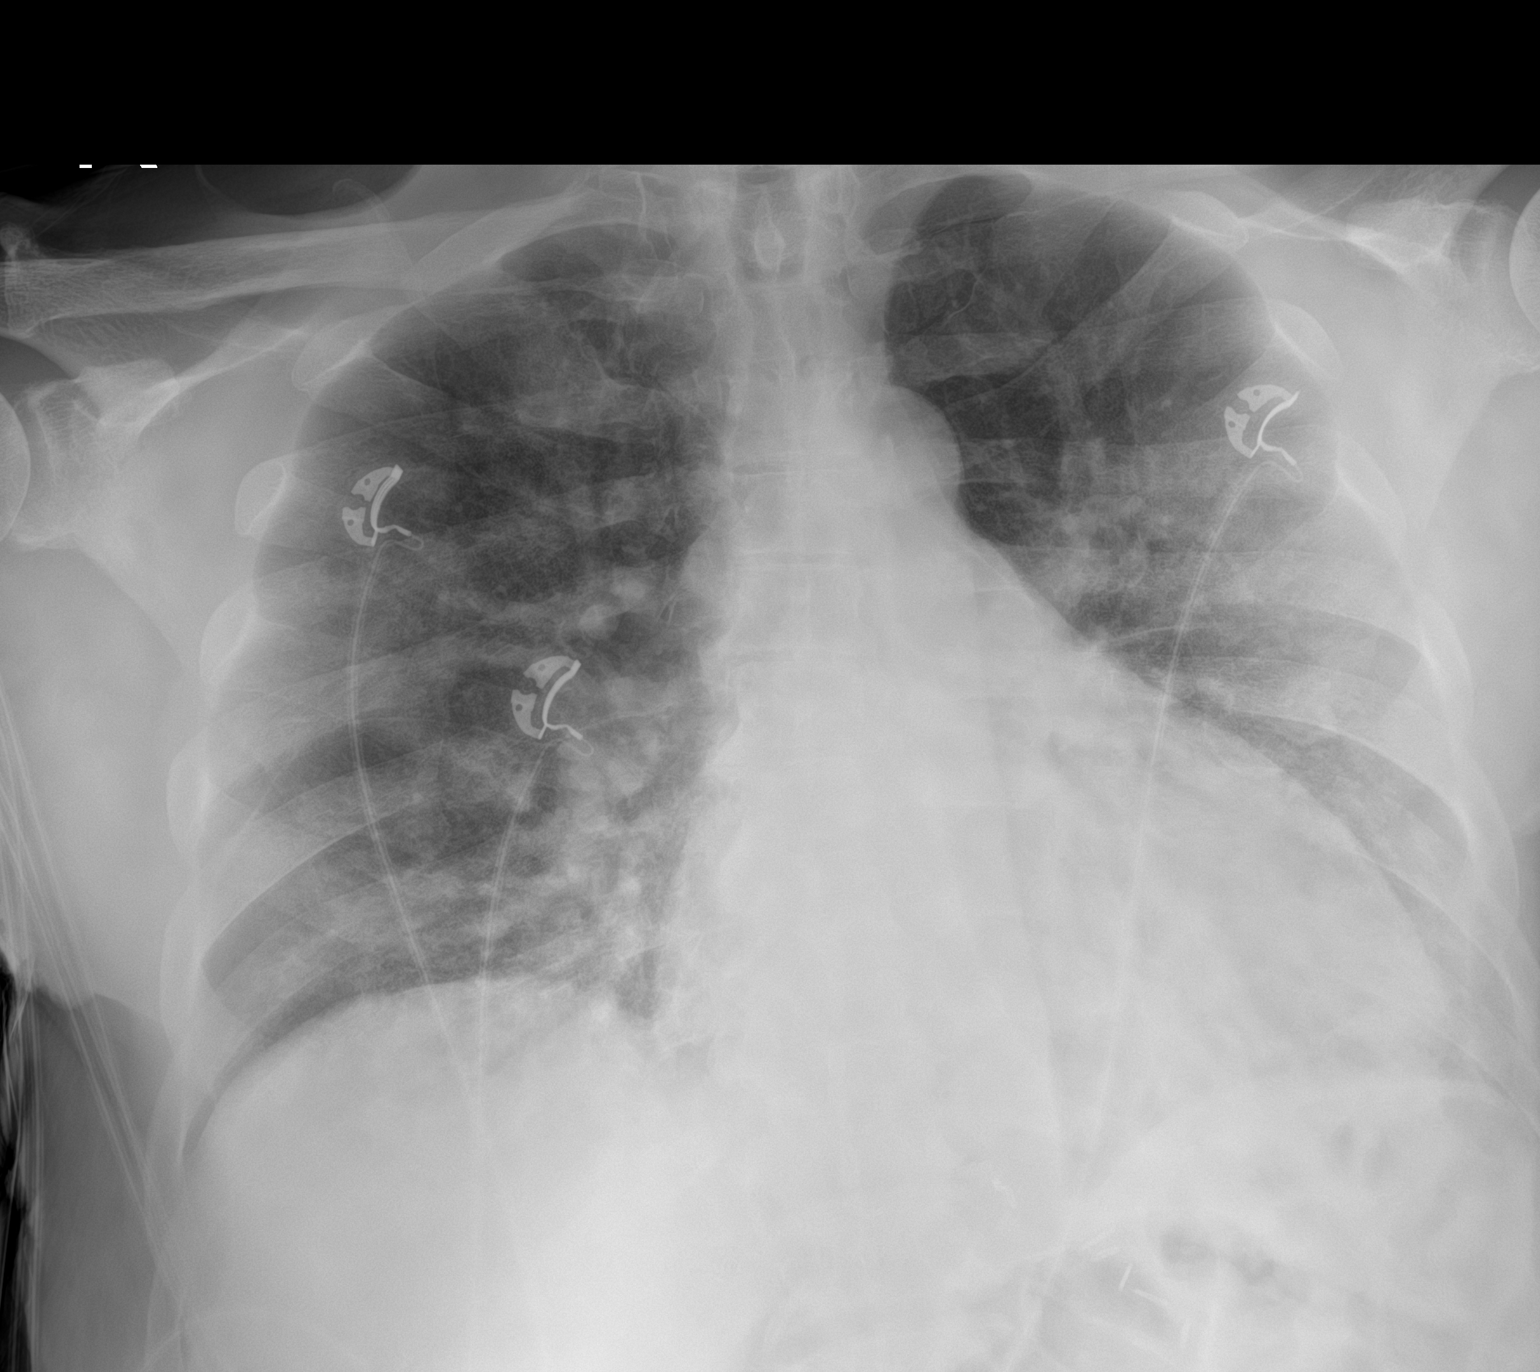

[1 of 1 positions shown; findings below may reference images not displayed]

FINDINGS: There is cardiomegaly with vascular congestion. Bilateral mid to
lower lung field predominant hazy opacities consistent with
multifocal pneumonia and in keeping with 6SQQJ-N2. Clinical
correlation is recommended. There is no pleural effusion
pneumothorax. No acute osseous pathology.
IMPRESSION: 1. Multifocal pneumonia.
2. Cardiomegaly with vascular congestion.

## 2022-06-11 IMAGING — DX DG CHEST 2V
2 series · 2 of 2 positions shown · non-contrast
Comparison: 08/30/2020

CLINICAL DATA: History of COVID

EXAM:
CHEST - 2 VIEW

[dg chest 2 view (1 of 2)]
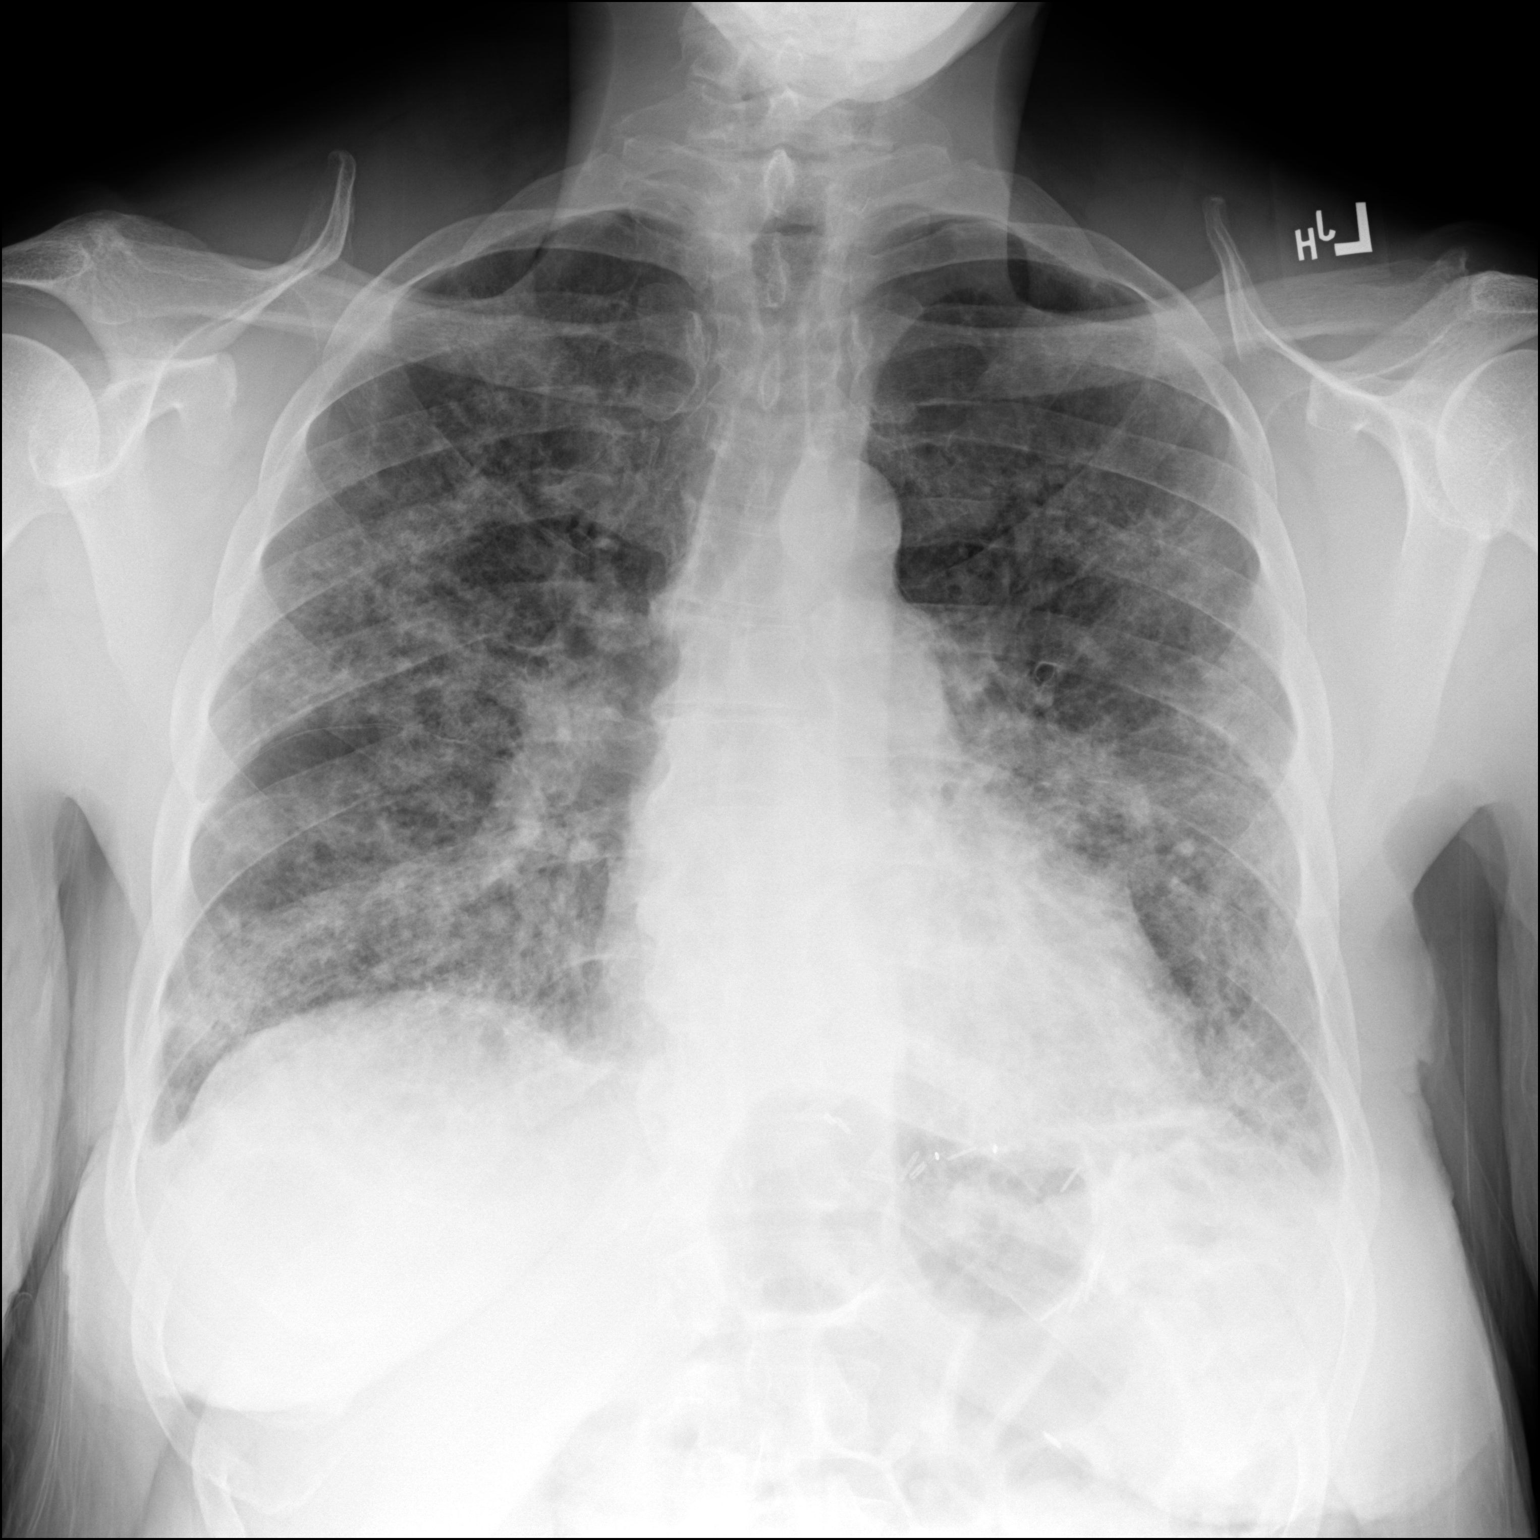

[dg chest 2 view (2 of 2)]
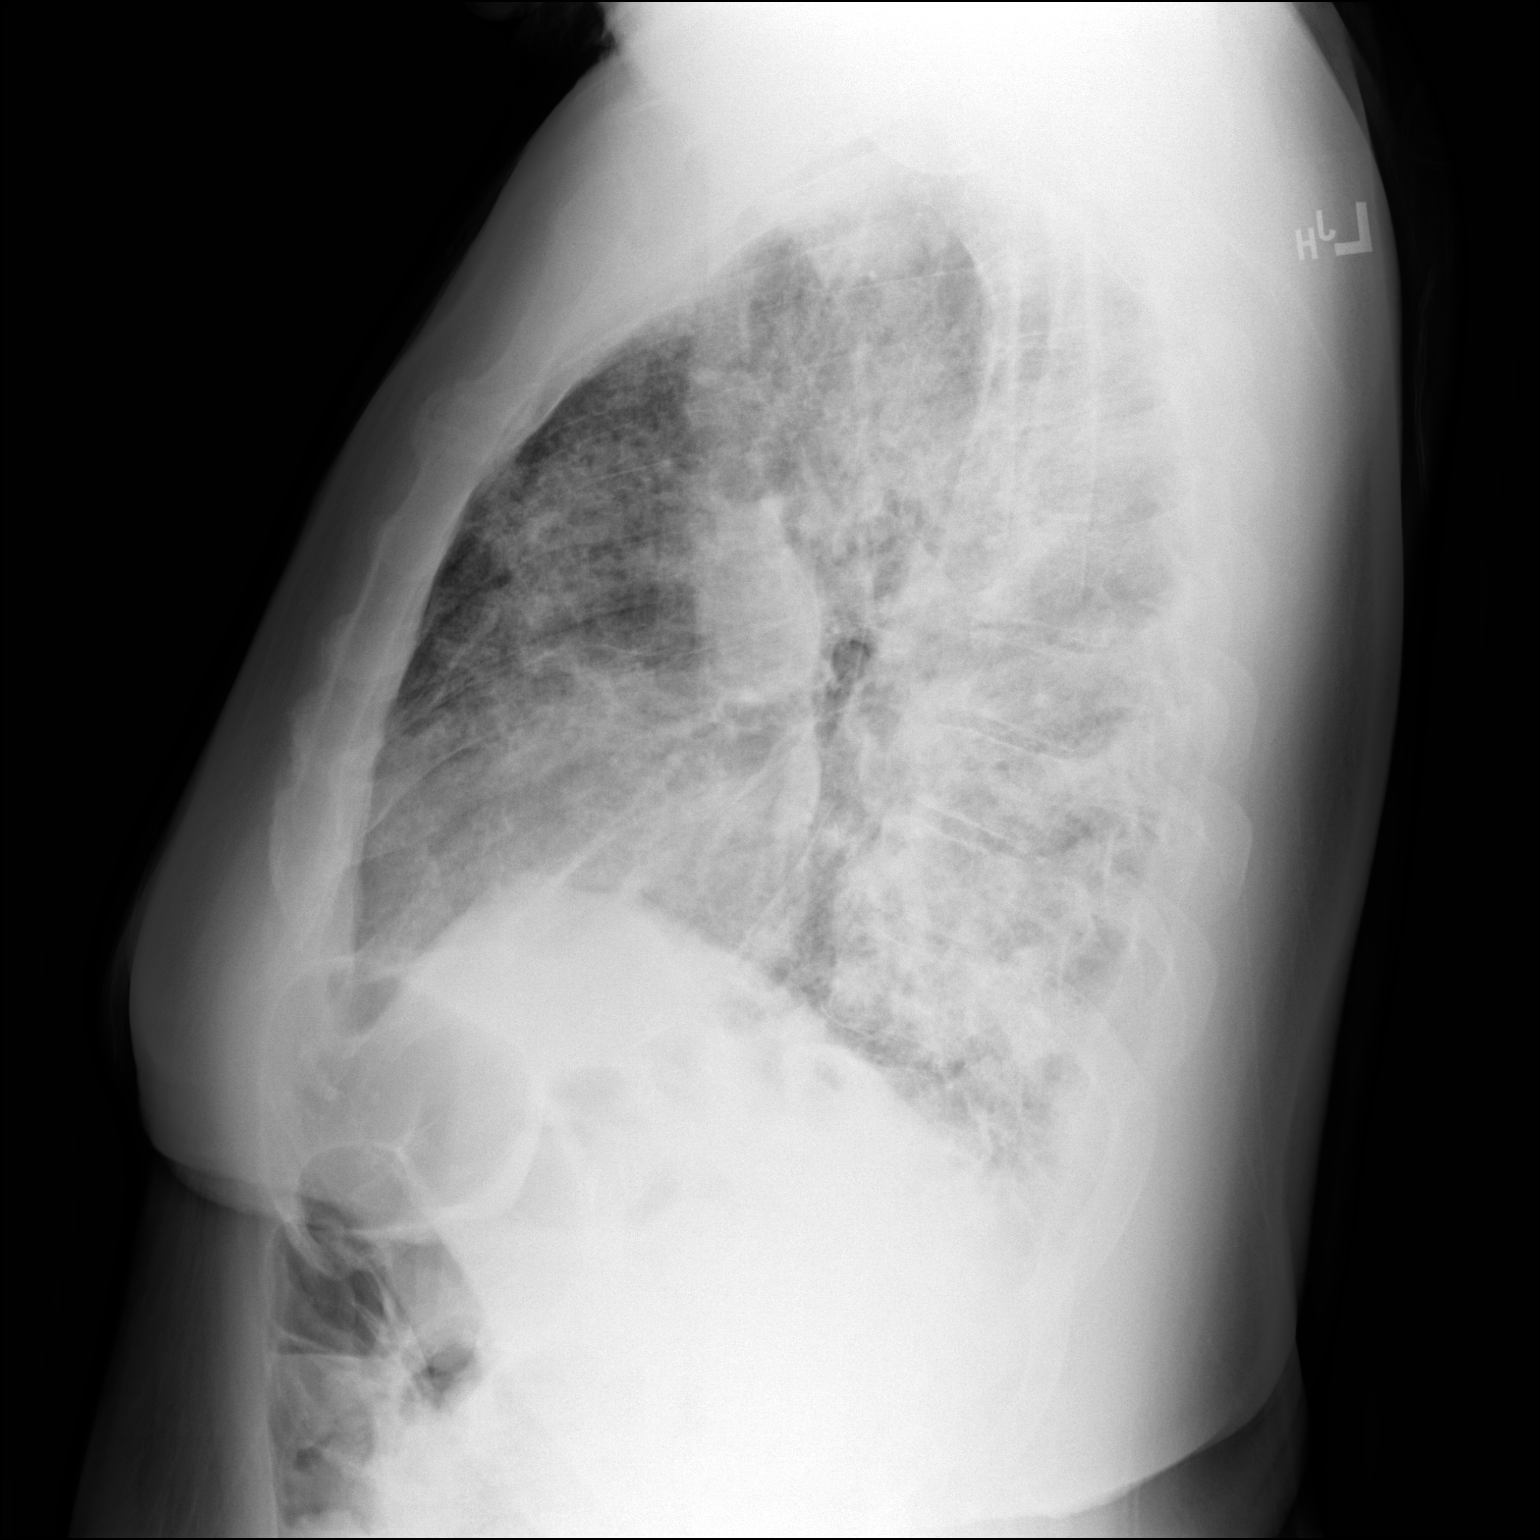

[2 of 2 positions shown; findings below may reference images not displayed]

FINDINGS: Widespread ground-glass opacities and patchy consolidations, worse
as compared with 08/30/2020. Stable cardiomediastinal silhouette. No
pneumothorax. No pleural effusion
IMPRESSION: Widespread ground-glass opacities and patchy consolidations, worse
as compared with 08/30/2020, presumably representing pneumonia
residual.

## 2022-09-21 DIAGNOSIS — Z13220 Encounter for screening for lipoid disorders: Secondary | ICD-10-CM | POA: Diagnosis not present

## 2022-09-21 DIAGNOSIS — Z125 Encounter for screening for malignant neoplasm of prostate: Secondary | ICD-10-CM | POA: Diagnosis not present

## 2022-09-21 DIAGNOSIS — Z0000 Encounter for general adult medical examination without abnormal findings: Secondary | ICD-10-CM | POA: Diagnosis not present

## 2023-10-02 DIAGNOSIS — Z125 Encounter for screening for malignant neoplasm of prostate: Secondary | ICD-10-CM | POA: Diagnosis not present

## 2023-10-02 DIAGNOSIS — Z0000 Encounter for general adult medical examination without abnormal findings: Secondary | ICD-10-CM | POA: Diagnosis not present

## 2023-10-02 DIAGNOSIS — Z6838 Body mass index (BMI) 38.0-38.9, adult: Secondary | ICD-10-CM | POA: Diagnosis not present

## 2023-10-02 DIAGNOSIS — I10 Essential (primary) hypertension: Secondary | ICD-10-CM | POA: Diagnosis not present

## 2023-10-02 DIAGNOSIS — Z13220 Encounter for screening for lipoid disorders: Secondary | ICD-10-CM | POA: Diagnosis not present

## 2024-06-09 ENCOUNTER — Encounter (HOSPITAL_COMMUNITY): Payer: Self-pay | Admitting: Emergency Medicine

## 2024-06-09 ENCOUNTER — Other Ambulatory Visit: Payer: Self-pay

## 2024-06-09 ENCOUNTER — Emergency Department (HOSPITAL_COMMUNITY)

## 2024-06-09 ENCOUNTER — Observation Stay (HOSPITAL_COMMUNITY)
Admission: EM | Admit: 2024-06-09 | Discharge: 2024-06-11 | Disposition: A | Discharge status: Home / Self Care | Attending: Orthopedic Surgery | Admitting: Orthopedic Surgery

## 2024-06-09 DIAGNOSIS — S92901A Unspecified fracture of right foot, initial encounter for closed fracture: Secondary | ICD-10-CM

## 2024-06-09 DIAGNOSIS — Y92410 Unspecified street and highway as the place of occurrence of the external cause: Secondary | ICD-10-CM | POA: Insufficient documentation

## 2024-06-09 DIAGNOSIS — S81811A Laceration without foreign body, right lower leg, initial encounter: Secondary | ICD-10-CM | POA: Diagnosis not present

## 2024-06-09 DIAGNOSIS — S72001A Fracture of unspecified part of neck of right femur, initial encounter for closed fracture: Principal | ICD-10-CM | POA: Diagnosis present

## 2024-06-09 DIAGNOSIS — M1711 Unilateral primary osteoarthritis, right knee: Secondary | ICD-10-CM | POA: Diagnosis not present

## 2024-06-09 DIAGNOSIS — S92331A Displaced fracture of third metatarsal bone, right foot, initial encounter for closed fracture: Secondary | ICD-10-CM | POA: Diagnosis not present

## 2024-06-09 DIAGNOSIS — S92301A Fracture of unspecified metatarsal bone(s), right foot, initial encounter for closed fracture: Secondary | ICD-10-CM | POA: Diagnosis not present

## 2024-06-09 DIAGNOSIS — S92311A Displaced fracture of first metatarsal bone, right foot, initial encounter for closed fracture: Secondary | ICD-10-CM | POA: Diagnosis not present

## 2024-06-09 DIAGNOSIS — S92321A Displaced fracture of second metatarsal bone, right foot, initial encounter for closed fracture: Secondary | ICD-10-CM | POA: Diagnosis not present

## 2024-06-09 DIAGNOSIS — M1611 Unilateral primary osteoarthritis, right hip: Secondary | ICD-10-CM | POA: Diagnosis not present

## 2024-06-09 DIAGNOSIS — S43401A Unspecified sprain of right shoulder joint, initial encounter: Secondary | ICD-10-CM | POA: Diagnosis not present

## 2024-06-09 DIAGNOSIS — M79604 Pain in right leg: Secondary | ICD-10-CM | POA: Diagnosis not present

## 2024-06-09 DIAGNOSIS — R079 Chest pain, unspecified: Secondary | ICD-10-CM | POA: Diagnosis not present

## 2024-06-09 DIAGNOSIS — S92244A Nondisplaced fracture of medial cuneiform of right foot, initial encounter for closed fracture: Secondary | ICD-10-CM | POA: Diagnosis not present

## 2024-06-09 DIAGNOSIS — J439 Emphysema, unspecified: Secondary | ICD-10-CM | POA: Diagnosis not present

## 2024-06-09 DIAGNOSIS — M19071 Primary osteoarthritis, right ankle and foot: Secondary | ICD-10-CM | POA: Diagnosis not present

## 2024-06-09 DIAGNOSIS — I10 Essential (primary) hypertension: Secondary | ICD-10-CM | POA: Diagnosis not present

## 2024-06-09 DIAGNOSIS — R519 Headache, unspecified: Secondary | ICD-10-CM | POA: Diagnosis not present

## 2024-06-09 DIAGNOSIS — M542 Cervicalgia: Secondary | ICD-10-CM | POA: Diagnosis not present

## 2024-06-09 DIAGNOSIS — S72111A Displaced fracture of greater trochanter of right femur, initial encounter for closed fracture: Principal | ICD-10-CM | POA: Insufficient documentation

## 2024-06-09 DIAGNOSIS — S72141A Displaced intertrochanteric fracture of right femur, initial encounter for closed fracture: Secondary | ICD-10-CM | POA: Diagnosis not present

## 2024-06-09 LAB — COMPREHENSIVE METABOLIC PANEL WITH GFR
ALT: 122 U/L — ABNORMAL HIGH (ref 0–44)
AST: 188 U/L — ABNORMAL HIGH (ref 15–41)
Albumin: 3.1 g/dL — ABNORMAL LOW (ref 3.5–5.0)
Alkaline Phosphatase: 57 U/L (ref 38–126)
Anion gap: 8 (ref 5–15)
BUN: 24 mg/dL — ABNORMAL HIGH (ref 6–20)
CO2: 19 mmol/L — ABNORMAL LOW (ref 22–32)
Calcium: 7.8 mg/dL — ABNORMAL LOW (ref 8.9–10.3)
Chloride: 112 mmol/L — ABNORMAL HIGH (ref 98–111)
Creatinine, Ser: 0.99 mg/dL (ref 0.61–1.24)
GFR, Estimated: >60 mL/min (ref >60)
Glucose, Bld: 123 mg/dL — ABNORMAL HIGH (ref 70–99)
Potassium: 4.3 mmol/L (ref 3.5–5.1)
Sodium: 139 mmol/L (ref 135–145)
Total Bilirubin: 1.7 mg/dL — ABNORMAL HIGH (ref 0.0–1.2)
Total Protein: 5.3 g/dL — ABNORMAL LOW (ref 6.5–8.1)

## 2024-06-09 LAB — I-STAT CHEM 8, ED
BUN: 24 mg/dL — ABNORMAL HIGH (ref 6–20)
Calcium, Ion: 1.1 mmol/L — ABNORMAL LOW (ref 1.15–1.40)
Chloride: 109 mmol/L (ref 98–111)
Creatinine, Ser: 1.1 mg/dL (ref 0.61–1.24)
Glucose, Bld: 129 mg/dL — ABNORMAL HIGH (ref 70–99)
HCT: 34 % — ABNORMAL LOW (ref 39.0–52.0)
Hemoglobin: 11.6 g/dL — ABNORMAL LOW (ref 13.0–17.0)
Potassium: 4.2 mmol/L (ref 3.5–5.1)
Sodium: 140 mmol/L (ref 135–145)
TCO2: 22 mmol/L (ref 22–32)

## 2024-06-09 LAB — PROTIME-INR
INR: 1.1 (ref 0.8–1.2)
Prothrombin Time: 15.3 s — ABNORMAL HIGH (ref 11.4–15.2)

## 2024-06-09 LAB — CBC
HCT: 37 % — ABNORMAL LOW (ref 39.0–52.0)
Hemoglobin: 11.4 g/dL — ABNORMAL LOW (ref 13.0–17.0)
MCH: 27.5 pg (ref 26.0–34.0)
MCHC: 30.8 g/dL (ref 30.0–36.0)
MCV: 89.2 fL (ref 80.0–100.0)
Platelets: 110 K/uL — ABNORMAL LOW (ref 150–400)
RBC: 4.15 MIL/uL — ABNORMAL LOW (ref 4.22–5.81)
RDW: 14.2 % (ref 11.5–15.5)
WBC: 7.9 K/uL (ref 4.0–10.5)
nRBC: 0 % (ref 0.0–0.2)

## 2024-06-09 LAB — SAMPLE TO BLOOD BANK

## 2024-06-09 LAB — I-STAT CG4 LACTIC ACID, ED: Lactic Acid, Venous: 1 mmol/L (ref 0.5–1.9)

## 2024-06-09 LAB — ETHANOL: Alcohol, Ethyl (B): <15 mg/dL (ref <15)

## 2024-06-09 MED ORDER — MORPHINE SULFATE (PF) 4 MG/ML IV SOLN
4.0000 mg | Freq: Once | INTRAVENOUS | Status: AC
  Administered 2024-06-09: 4 mg via INTRAVENOUS
  Filled 2024-06-09: qty 1

## 2024-06-09 MED ORDER — MORPHINE SULFATE (PF) 4 MG/ML IV SOLN
4.0000 mg | INTRAVENOUS | Status: DC | PRN
  Administered 2024-06-10 (×2): 4 mg via INTRAVENOUS
  Filled 2024-06-09 (×2): qty 1

## 2024-06-09 MED ORDER — SODIUM CHLORIDE 0.9 % IV SOLN: INTRAVENOUS | Status: AC

## 2024-06-09 MED ORDER — LIDOCAINE HCL (PF) 1 % IJ SOLN
5.0000 mL | Freq: Once | INTRAMUSCULAR | Status: AC
  Administered 2024-06-09: 5 mL via INTRADERMAL
  Filled 2024-06-09: qty 5

## 2024-06-09 MED ORDER — IOHEXOL 350 MG/ML SOLN
100.0000 mL | Freq: Once | INTRAVENOUS | Status: AC | PRN
  Administered 2024-06-09: 100 mL via INTRAVENOUS

## 2024-06-09 NOTE — ED Notes (Signed)
 Pt OTF to CT
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
## 2024-06-09 NOTE — ED Provider Notes (Signed)
.  Laceration Repair  Date/Time: 06/09/2024 11:49 PM  Performed by: Dorcus Fallow, MD Authorized by: Ernie Cough, MD   Consent:    Consent obtained:  Verbal   Consent given by:  Patient   Risks, benefits, and alternatives were discussed: yes     Risks discussed:  Infection, need for additional repair, poor wound healing, poor cosmetic result, nerve damage, pain, vascular damage, tendon damage and retained foreign body   Alternatives discussed:  Delayed treatment and no treatment Universal protocol:    Procedure explained and questions answered to patient or proxy's satisfaction: yes     Relevant documents present and verified: yes     Test results available: yes     Imaging studies available: yes     Required blood products, implants, devices, and special equipment available: yes     Site/side marked: yes     Immediately prior to procedure, a time out was called: yes     Patient identity confirmed:  Arm band and verbally with patient Anesthesia:    Anesthesia method:  Local infiltration   Local anesthetic:  Lidocaine  1% WITH epi Laceration details:    Location:  Leg   Leg location:  R lower leg   Length (cm):  3 Pre-procedure details:    Preparation:  Patient was prepped and draped in usual sterile fashion and imaging obtained to evaluate for foreign bodies Exploration:    Limited defect created (wound extended): no     Hemostasis achieved with:  Epinephrine   Imaging obtained: x-ray     Imaging outcome: foreign body not noted     Wound exploration: wound explored through full range of motion and entire depth of wound visualized     Contaminated: no   Treatment:    Area cleansed with:  Saline   Amount of cleaning:  Standard   Irrigation solution:  Sterile saline   Irrigation volume:  1000cc   Irrigation method:  Syringe and pressure wash   Visualized foreign bodies/material removed: no     Debridement:  None   Undermining:  None   Scar revision: no      Layers/structures repaired:  Muscle belly Muscle belly:    Suture size:  3-0   Suture material:  Chromic gut   Suture technique:  Simple interrupted   Number of sutures:  4 Skin repair:    Repair method:  Sutures   Suture size:  3-0   Suture material:  Chromic gut   Suture technique:  Simple interrupted   Number of sutures:  4 Approximation:    Approximation:  Close Repair type:    Repair type:  Simple Post-procedure details:    Dressing:  Open (no dressing)   Procedure completion:  Tolerated well, no immediate complications        Dorcus Fallow, MD 06/09/24 2351    Tonia Chew, MD 06/16/24 1357

## 2024-06-09 NOTE — ED Notes (Signed)
 Pt back to room 015 from CT
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
## 2024-06-10 MED ORDER — ONDANSETRON HCL 4 MG/2ML IJ SOLN: 4.0000 mg | Freq: Four times a day (QID) | INTRAMUSCULAR | Status: DC | PRN

## 2024-06-10 MED ORDER — TRAMADOL HCL 50 MG PO TABS
50.0000 mg | ORAL_TABLET | Freq: Four times a day (QID) | ORAL | Status: DC | PRN
  Administered 2024-06-10: 100 mg via ORAL
  Administered 2024-06-10: 75 mg via ORAL
  Filled 2024-06-10 (×2): qty 2

## 2024-06-10 MED ORDER — ENOXAPARIN SODIUM 60 MG/0.6ML IJ SOSY
60.0000 mg | PREFILLED_SYRINGE | INTRAMUSCULAR | Status: DC
  Administered 2024-06-10: 60 mg via SUBCUTANEOUS
  Filled 2024-06-10: qty 0.6

## 2024-06-10 MED ORDER — MORPHINE SULFATE (PF) 2 MG/ML IV SOLN: 2.0000 mg | INTRAVENOUS | Status: DC | PRN

## 2024-06-10 NOTE — Progress Notes (Signed)
 Orthopedic Tech Progress Note Patient Details:  Todd [name] 1966/04/25 [id number]  Ortho Devices Type of Ortho Device: Post (short leg) splint Ortho Device/Splint Location: rlr Ortho Device/Splint Interventions: Ordered, Application, Adjustment  I applied splint with rn assist. I had to wait to splint due to stitches being needed. Post Interventions Patient Tolerated: Well Instructions Provided: Care of device, Adjustment of device  [name] PARAS 06/10/2024, 12:46 AM

## 2024-06-10 NOTE — ED Notes (Signed)
 Notified 5N that patient will be coming to the floor in next few minutes. Nurse will call back if there are any questions.
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
## 2024-06-11 MED ORDER — KETOROLAC TROMETHAMINE 15 MG/ML IJ SOLN
7.5000 mg | Freq: Four times a day (QID) | INTRAMUSCULAR | Status: DC
  Administered 2024-06-11 (×2): 7.5 mg via INTRAVENOUS
  Filled 2024-06-11 (×2): qty 1

## 2024-06-11 MED ORDER — TRAMADOL HCL 50 MG PO TABS: 50.0000 mg | ORAL_TABLET | Freq: Four times a day (QID) | ORAL | 0 refills | Status: AC | PRN

## 2024-06-11 NOTE — Progress Notes (Signed)
    Durable Medical Equipment  (From admission, onward)           Start     Ordered   06/11/24 1034  For home use only DME lightweight manual wheelchair with seat cushion  Once       Comments: Patient suffers from right greater troch and multiple foot fxs which impairs their ability to perform daily activities like bathing in the home.  A walker will not resolve  issue with performing activities of daily living. A wheelchair will allow patient to safely perform daily activities. Patient is not able to propel themselves in the home using a standard weight wheelchair due to general weakness. Patient can self propel in the lightweight wheelchair. Length of need 6 months . Accessories: elevating leg rests (ELRs), wheel locks, extensions and anti-tippers.   06/11/24 1034
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
## 2024-06-11 NOTE — TOC Transition Note (Signed)
 Transition of Care [facility]) - Discharge Note   Patient Details  Name: Todd [name] MRN: [id number] Date of Birth: May 27, 1966  [facility] CM/SW Contact:  Rosalva Jon Bloch, RN Phone Number: 06/11/2024, 12:25 PM   Clinical Narrative:    Patient will DC to: home Anticipated DC date: 06/11/2024 Family notified: yes Transport by: car  Per MD patient ready for DC today . RN, patient, and patient's wife notified of DC. Referral made with [employer] for DME. Equipment will be delivered to bedside prior to d/c. Pt without RX med concerns . Post hospital f/u noted on AVS.  Pt without transportation issues.  RNCM will sign off for now as intervention is no longer needed. Please consult us  again if new needs arise.     Barriers to Discharge: No Barriers Identified   Patient Goals and CMS Choice     Choice offered to / list presented to : Patient      Discharge Placement                       Discharge Plan and Services Additional resources added to the After Visit Summary for                  DME Arranged: Bedside commode, Walker rolling, Lightweight manual wheelchair with seat cushion   Date DME Agency Contacted: 06/11/24 Time DME Agency Contacted: 1046 Representative spoke with at DME Agency: Jermaine            Social Drivers of Health (SDOH) Interventions SDOH Screenings   Food Insecurity: No Food Insecurity (06/11/2024)  Housing: Low Risk  (06/11/2024)  Transportation Needs: No Transportation Needs (06/11/2024)  Utilities: Not At Risk (06/11/2024)  Tobacco Use: Low Risk  (06/09/2024)     Readmission Risk Interventions     No data to display

## 2024-06-11 NOTE — Plan of Care (Signed)
  Problem: Education: Goal: Knowledge of General Education information will improve Description: Including pain rating scale, medication(s)/side effects and non-pharmacologic comfort measures Outcome: Progressing   Problem: Health Behavior/Discharge Planning: Goal: Ability to manage health-related needs will improve Outcome: Progressing   Problem: Clinical Measurements: Goal: Ability to maintain clinical measurements within normal limits will improve Outcome: Progressing   Problem: Activity: Goal: Risk for activity intolerance will decrease Outcome: Progressing   Problem: Pain Managment: Goal: General experience of comfort will improve and/or be controlled Outcome: Progressing   Problem: Safety: Goal: Ability to remain free from injury will improve Outcome: Progressing
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
## 2024-06-17 ENCOUNTER — Other Ambulatory Visit (HOSPITAL_COMMUNITY): Payer: Self-pay | Admitting: Orthopaedic Surgery

## 2024-06-17 ENCOUNTER — Ambulatory Visit (HOSPITAL_COMMUNITY)
Admission: RE | Admit: 2024-06-17 | Discharge: 2024-06-17 | Disposition: A | Discharge status: Home / Self Care | Source: Ambulatory Visit | Attending: Vascular Surgery | Admitting: Vascular Surgery

## 2024-06-17 DIAGNOSIS — S92331A Displaced fracture of third metatarsal bone, right foot, initial encounter for closed fracture: Secondary | ICD-10-CM | POA: Diagnosis not present

## 2024-06-17 DIAGNOSIS — S92241A Displaced fracture of medial cuneiform of right foot, initial encounter for closed fracture: Secondary | ICD-10-CM | POA: Diagnosis not present

## 2024-06-17 DIAGNOSIS — R52 Pain, unspecified: Secondary | ICD-10-CM | POA: Insufficient documentation

## 2024-06-17 DIAGNOSIS — S92311A Displaced fracture of first metatarsal bone, right foot, initial encounter for closed fracture: Secondary | ICD-10-CM | POA: Diagnosis not present

## 2024-06-17 DIAGNOSIS — S92321A Displaced fracture of second metatarsal bone, right foot, initial encounter for closed fracture: Secondary | ICD-10-CM | POA: Diagnosis not present

## 2024-06-18 NOTE — Progress Notes (Signed)
 Sent message, via epic in basket, requesting orders in epic from Careers adviser.

## 2024-06-19 NOTE — Progress Notes (Signed)
 Request sent to [facility] to send pre op orders for PST visit 06/20/24.

## 2024-06-19 NOTE — Progress Notes (Signed)
 COVID Vaccine received:  []  No []  Yes Date of any COVID positive Test in last 90 days:  PCP - Dr. Tanda Bame Cardiologist -   Chest x-ray - 06/09/24 Epic EKG -  06/09/24 Epic Stress Test -  ECHO - 08/31/20 Epic Cardiac Cath -   Bowel Prep - []  No  []   Yes ______  Pacemaker / ICD device []  No []  Yes   Spinal Cord Stimulator:[]  No []  Yes       History of Sleep Apnea? []  No []  Yes   CPAP used?- []  No []  Yes    Does the patient monitor blood sugar?          []  No []  Yes  []  N/A  Patient has: []  NO Hx DM   []  Pre-DM                 []  DM1  []   DM2 Does patient have a [employer] or Dexacom? []  No []  Yes   Fasting Blood Sugar Ranges-  Checks Blood Sugar _____ times a day  GLP1 agonist / usual dose -  GLP1 instructions:  SGLT-2 inhibitors / usual dose -  SGLT-2 instructions:   Blood Thinner / Instructions: Aspirin  Instructions:  Comments:   Activity level: Patient is able / unable to climb a flight of stairs without difficulty; []  No CP  []  No SOB, but would have ___   Patient can / can not perform ADLs without assistance.   Anesthesia review:   Patient denies shortness of breath, fever, cough and chest pain at PAT appointment.  Patient verbalized understanding and agreement to the Pre-Surgical Instructions that were given to them at this PAT appointment. Patient was also educated of the need to review these PAT instructions again prior to his/her surgery.I reviewed the appropriate phone numbers to call if they have any and questions or concerns.

## 2024-06-19 NOTE — Patient Instructions (Signed)
 SURGICAL WAITING ROOM VISITATION  Patients having surgery or a procedure may have no more than 2 support people in the waiting area - these visitors may rotate.    Children under the age of 74 must have an adult with them who is not the patient.  Visitors with respiratory illnesses are discouraged from visiting and should remain at home.  If the patient needs to stay at the hospital during part of their recovery, the visitor guidelines for inpatient rooms apply. Pre-op nurse will coordinate an appropriate time for 1 support person to accompany patient in pre-op.  This support person may not rotate.    Please refer to the [facility] website for the visitor guidelines for Inpatients (after your surgery is over and you are in a regular room).       Your procedure is scheduled on: 06/23/24   Report to [facility] Main Entrance    Report to admitting at 5:15 AM   Call this number if you have problems the morning of surgery [phone]   Do not eat food :After Midnight.   After Midnight you may have the following liquids until ______ AM/ PM DAY OF SURGERY  Water Non-Citrus Juices (without pulp, NO RED-Apple, White grape, White cranberry) Black Coffee (NO MILK/CREAM OR CREAMERS, sugar ok)  Clear Tea (NO MILK/CREAM OR CREAMERS, sugar ok) regular and decaf                             Plain Jell-O (NO RED)                                           Fruit ices (not with fruit pulp, NO RED)                                     Popsicles (NO RED)                                                               Sports drinks like Gatorade (NO RED)              Drink 2 Ensure/G2 drinks AT 10:00 PM the night before surgery.        The day of surgery:  Drink ONE (1) Pre-Surgery Clear Ensure or G2 at AM the morning of surgery. Drink in one sitting. Do not sip.  This drink was given to you during your hospital  pre-op appointment visit. Nothing else to drink after completing the   Pre-Surgery Clear Ensure or G2.       Oral Hygiene is also important to reduce your risk of infection.                                    Remember - BRUSH YOUR TEETH THE MORNING OF SURGERY WITH YOUR REGULAR TOOTHPASTE   Stop all vitamins and herbal supplements 7 days before surgery.   Take these medicines the morning of surgery with A SIP OF WATER:  Tylenol    Bring CPAP mask and tubing day of surgery.                              You may not have any metal on your body including hair pins, jewelry, and body piercing             Do not wear make-up, lotions, powders, perfumes/cologne, or deodorant              Men may shave face and neck.   Do not bring valuables to the hospital. [facility] IS NOT             RESPONSIBLE   FOR VALUABLES.   Contacts, glasses, dentures or bridgework may not be worn into surgery.   Bring small overnight bag day of surgery.   DO NOT BRING YOUR HOME MEDICATIONS TO THE HOSPITAL. PHARMACY WILL DISPENSE MEDICATIONS LISTED ON YOUR MEDICATION LIST TO YOU DURING YOUR ADMISSION IN THE HOSPITAL!    Patients discharged on the day of surgery will not be allowed to drive home.  Someone NEEDS to stay with you for the first 24 hours after anesthesia.   Special Instructions: Bring a copy of your healthcare power of attorney and living will documents the day of surgery if you haven't scanned them before.              Please read over the following fact sheets you were given: IF YOU HAVE QUESTIONS ABOUT YOUR PRE-OP INSTRUCTIONS PLEASE CALL [phone] [place]   If you received a COVID test during your pre-op visit  it is requested that you wear a mask when out in public, stay away from anyone that may not be feeling well and notify your surgeon if you develop symptoms. If you test positive for Covid or have been in contact with anyone that has tested positive in the last 10 days please notify you surgeon.    [facility] - Preparing for Surgery Before surgery, you can  play an important role.  Because skin is not sterile, your skin needs to be as free of germs as possible.  You can reduce the number of germs on your skin by washing with CHG (chlorahexidine gluconate) soap before surgery.  CHG is an antiseptic cleaner which kills germs and bonds with the skin to continue killing germs even after washing. Please DO NOT use if you have an allergy to CHG or antibacterial soaps.  If your skin becomes reddened/irritated stop using the CHG and inform your nurse when you arrive at Short Stay. Do not shave (including legs and underarms) for at least 48 hours prior to the first CHG shower.  You may shave your face/neck.  Please follow these instructions carefully:  1.  Shower with CHG Soap the night before surgery ONLY (DO NOT USE THE SOAP THE MORNING OF SURGERY).  2.  If you choose to wash your hair, wash your hair first as usual with your normal  shampoo.  3.  After you shampoo, rinse your hair and body thoroughly to remove the shampoo.                             4.  Use CHG as you would any other liquid soap.  You can apply chg directly to the skin and wash.  Gently with a scrungie or clean washcloth.  5.  Apply the CHG  Soap to your body ONLY FROM THE NECK DOWN.   Do   not use on face/ open                           Wound or open sores. Avoid contact with eyes, ears mouth and   genitals (private parts).                       Wash face,  Genitals (private parts) with your normal soap.             6.  Wash thoroughly, paying special attention to the area where your    surgery  will be performed.  7.  Thoroughly rinse your body with warm water from the neck down.  8.  DO NOT shower/wash with your normal soap after using and rinsing off the CHG Soap.                9.  Pat yourself dry with a clean towel.            10.  Wear clean pajamas.            11.  Place clean sheets on your bed the night of your first shower and do not  sleep with pets. Day of Surgery : Do not  apply any CHG, lotions/deodorants the morning of surgery.  Please wear clean clothes to the hospital/surgery center.  FAILURE TO FOLLOW THESE INSTRUCTIONS MAY RESULT IN THE CANCELLATION OF YOUR SURGERY  PATIENT SIGNATURE_________________________________  NURSE SIGNATURE__________________________________  ________________________________________________________________________
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
## 2024-06-20 ENCOUNTER — Other Ambulatory Visit: Payer: Self-pay

## 2024-06-20 ENCOUNTER — Encounter (HOSPITAL_COMMUNITY)
Admission: RE | Admit: 2024-06-20 | Discharge: 2024-06-20 | Disposition: A | Discharge status: Home / Self Care | Source: Ambulatory Visit | Attending: Orthopaedic Surgery | Admitting: Orthopaedic Surgery

## 2024-06-20 ENCOUNTER — Encounter (HOSPITAL_COMMUNITY): Payer: Self-pay

## 2024-06-20 DIAGNOSIS — X58XXXA Exposure to other specified factors, initial encounter: Secondary | ICD-10-CM | POA: Diagnosis not present

## 2024-06-20 DIAGNOSIS — Z01818 Encounter for other preprocedural examination: Secondary | ICD-10-CM

## 2024-06-20 DIAGNOSIS — S92311A Displaced fracture of first metatarsal bone, right foot, initial encounter for closed fracture: Secondary | ICD-10-CM | POA: Diagnosis not present

## 2024-06-20 DIAGNOSIS — Z01812 Encounter for preprocedural laboratory examination: Secondary | ICD-10-CM | POA: Insufficient documentation

## 2024-06-20 DIAGNOSIS — S92331A Displaced fracture of third metatarsal bone, right foot, initial encounter for closed fracture: Secondary | ICD-10-CM | POA: Diagnosis not present

## 2024-06-20 DIAGNOSIS — S92811A Other fracture of right foot, initial encounter for closed fracture: Secondary | ICD-10-CM | POA: Diagnosis not present

## 2024-06-20 DIAGNOSIS — M1990 Unspecified osteoarthritis, unspecified site: Secondary | ICD-10-CM | POA: Diagnosis not present

## 2024-06-20 DIAGNOSIS — S92321A Displaced fracture of second metatarsal bone, right foot, initial encounter for closed fracture: Secondary | ICD-10-CM | POA: Diagnosis not present

## 2024-06-20 DIAGNOSIS — I10 Essential (primary) hypertension: Secondary | ICD-10-CM | POA: Diagnosis not present

## 2024-06-20 NOTE — Discharge Instructions (Signed)
 Lillia Mountain, MD [facility]  Please read the following information regarding your care after surgery.  Medications   - Oxycodone 5 mg every 4-6 hours as needed for pain - Aspirin  81 mg twice daily as scheduled to prevent blood clots - Colace 100 mg twice daily as needed for constipation - Zofran  4 mg every 8 hours as needed for nausea/vomiting  We send above prescriptions to your pharmacy on file.  In addition you may also use: ? acetaminophen  (Tylenol ) 500 mg every 4-6 hours as you need for minor to moderate pain  Resume all other routine medications per usual or as directed by your PCP/other specialists.  Weight Bearing ? Do NOT bear any weight on the operated leg or foot. This means do NOT touch your surgical leg to the ground!  Cast / Splint / Dressing ? If you have a splint, do NOT remove this. Keep your splint, cast or dressing clean and dry.  Don't put anything (coat hanger, pencil, etc) down inside of it.  If it gets wet, call the office immediately to schedule an appointment for a cast change.  Swelling IMPORTANT: It is normal for you to have swelling where you had surgery. To reduce swelling and pain, keep at least 3 pillows under your leg so that your toes are above your nose and your heel is above the level of your hip.  It may be necessary to keep your foot or leg elevated for several weeks.  This is critical to helping your incisions heal and your pain to feel better.  Follow Up Call my office at [phone] when you are discharged from the hospital or surgery center to schedule an appointment to be seen 7-10 days after surgery.  Call my office at [phone] if you develop a fever >101.5 F, nausea, vomiting, bleeding from the surgical site or severe pain.

## 2024-06-20 NOTE — Progress Notes (Signed)
 COVID Vaccine received:  [x]  No []  Yes Date of any COVID positive Test in last 90 days: 2020 PCP - Dr. Tanda Bame Cardiologist - n/a  Chest x-ray - 06/09/24 Epic EKG -  06/09/24 Epic Stress Test -  ECHO - 08/31/20 Epic Cardiac Cath -   Bowel Prep - [x]  No  []   Yes ______  Pacemaker / ICD device [x]  No []  Yes   Spinal Cord Stimulator:[x]  No []  Yes       History of Sleep Apnea? []  No [x]  Yes   CPAP used?- [x]  No []  Yes    Does the patient monitor blood sugar?          [x]  No []  Yes  []  N/A  Patient has: [x]  NO Hx DM   []  Pre-DM                 []  DM1  []   DM2 Does patient have a [employer] or Dexacom? []  No []  Yes   Fasting Blood Sugar Ranges-  Checks Blood Sugar _____ times a day  GLP1 agonist / usual dose - no GLP1 instructions:  SGLT-2 inhibitors / usual dose - no SGLT-2 instructions:   Blood Thinner / Instructions:no Aspirin  Instructions:no  Comments:   Activity level: Patient is  unable to climb a flight of stairs without difficulty; [x]  No CP  [x]  No SOB, but would have _leg fracture__   Patient can  perform ADLs without assistance.   Anesthesia review:   Patient denies shortness of breath, fever, cough and chest pain at PAT appointment.  Patient verbalized understanding and agreement to the Pre-Surgical Instructions that were given to them at this PAT appointment. Patient was also educated of the need to review these PAT instructions again prior to his/her surgery.I reviewed the appropriate phone numbers to call if they have any and questions or concerns.

## 2024-06-20 NOTE — H&P (Signed)
 ORTHOPAEDIC SURGERY H&P  Subjective:  The patient presents for right 1st metatarsal ORIF, possible ORIF vs pinning of 2nd and 3rd metatarsals, possible midfoot ORIF.   Past Medical History:  Diagnosis Date   Arthritis    Hypertension    Obesity     Past Surgical History:  Procedure Laterality Date   COLONOSCOPY WITH PROPOFOL  N/A 08/07/2017   Procedure: COLONOSCOPY WITH PROPOFOL ;  Surgeon: Dianna Specking, MD;  Location: [facility];  Service: Endoscopy;  Laterality: N/A;   FOOT SURGERY     GASTROPLASTY DUODENAL SWITCH     LEG SURGERY       (Not in an outpatient encounter)    Allergies  Allergen Reactions   Amoxil [Amoxicillin] Hives and Shortness Of Breath    Has patient had a PCN reaction causing immediate rash, facial/tongue/throat swelling, SOB or lightheadedness with hypotension: Yes Has patient had a PCN reaction causing severe rash involving mucus membranes or skin necrosis: Yes Has patient had a PCN reaction that required hospitalization: No Has patient had a PCN reaction occurring within the last 10 years: Yes If all of the above answers are NO, then may proceed with Cephalosporin use.    Oxycodone-Acetaminophen  Dermatitis   Tylox [Oxycodone-Acetaminophen ] Rash    Social History   Socioeconomic History   Marital status: Married    Spouse name: Not on file   Number of children: Not on file   Years of education: Not on file   Highest education level: Not on file  Occupational History   Not on file  Tobacco Use   Smoking status: Never   Smokeless tobacco: Never  Vaping Use   Vaping status: Never Used  Substance and Sexual Activity   Alcohol use: Yes    Comment: occasionally    Drug use: No   Sexual activity: Not on file  Other Topics Concern   Not on file  Social History Narrative   Not on file   Social Drivers of Health   Financial Resource Strain: Not on file  Food Insecurity: No Food Insecurity (06/11/2024)   Hunger Vital Sign     Worried About Running Out of Food in the Last Year: Never true    Ran Out of Food in the Last Year: Never true  Transportation Needs: No Transportation Needs (06/11/2024)   PRAPARE - Administrator, Civil Service (Medical): No    Lack of Transportation (Non-Medical): No  Physical Activity: Not on file  Stress: Not on file  Social Connections: Not on file  Intimate Partner Violence: Not At Risk (06/11/2024)   Humiliation, Afraid, Rape, and Kick questionnaire    Fear of Current or Ex-Partner: No    Emotionally Abused: No    Physically Abused: No    Sexually Abused: No     History reviewed. No pertinent family history.   Review of Systems Pertinent items are noted in HPI.  Objective: Vital signs in last 24 hours:    06/20/2024    2:35 PM 06/11/2024    7:57 AM 06/11/2024    4:20 AM  Vitals with BMI  Height 6' 0    Weight 275 lbs    BMI 37.29    Systolic 145 116 895  Diastolic 78 68 54  Pulse 62 71 65      EXAM: General: Well nourished, well developed. Awake, alert and oriented to time, place, person. Normal mood and affect. No apparent distress. Breathing room air.  Operative Lower Extremity: Alignment - Neutral Deformity -  None Skin intact Tenderness to palpation - right forefoot 5/5 TA, PT, GS, Per, EHL, FHL Sensation intact to light touch throughout Palpable DP and PT pulses Special testing: None  The contralateral foot/ankle was examined for comparison and noted to be neurovascularly intact with no localized deformity, swelling, or tenderness.  Imaging Review All images taken were independently reviewed by me.  Assessment/Plan: The clinical and radiographic findings were reviewed and discussed at length with the patient.  The patient presents for right 1st metatarsal ORIF, possible ORIF vs pinning of 2nd and 3rd metatarsals, possible midfoot ORIF.  We spoke at length about the natural course of these findings. We discussed nonoperative and  operative treatment options in detail.  The risks and benefits were presented and reviewed. The risks due to suture/hardware failure/irritation (or if removing hardware inability to remove part/all of hardware, recurrent instability), new/persistent/recurrent infection, stiffness, nerve/vessel/tendon injury, nonunion/malunion of any fracture, wound healing issues, allograft usage, development of arthritis, failure of this surgery, possibility of external fixation in certain situations, possibility of delayed definitive surgery, need for further surgery, prolonged wound care including further soft tissue coverage procedures, thromboembolic events, anesthesia/medical complications/events perioperatively and beyond, amputation, death among others were discussed. The patient acknowledged the explanation and agreed to proceed with the plan.  Todd [name]  Orthopaedic Surgery [facility]

## 2024-06-20 NOTE — Patient Instructions (Signed)
 SURGICAL WAITING ROOM VISITATION  Patients having surgery or a procedure may have no more than 2 support people in the waiting area - these visitors may rotate.    Children under the age of 57 must have an adult with them who is not the patient.  Visitors with respiratory illnesses are discouraged from visiting and should remain at home.  If the patient needs to stay at the hospital during part of their recovery, the visitor guidelines for inpatient rooms apply. Pre-op nurse will coordinate an appropriate time for 1 support person to accompany patient in pre-op.  This support person may not rotate.    Please refer to the [facility] website for the visitor guidelines for Inpatients (after your surgery is over and you are in a regular room).    Your procedure is scheduled on: 06/23/24   Report to [facility] Main Entrance    Report to admitting at  5:15 AM   Call this number if you have problems the morning of surgery [phone]   Do not eat food or drink liquids :After Midnight. But may have sips of water to take meds.     Oral Hygiene is also important to reduce your risk of infection.                                    Remember - BRUSH YOUR TEETH THE MORNING OF SURGERY WITH YOUR REGULAR TOOTHPASTE   Do NOT smoke after Midnight   Stop all vitamins and herbal supplements 7 days before surgery.   Take these medicines the morning of surgery with A SIP OF WATER: Tylenol              You may not have any metal on your body including hair pins, jewelry, and body piercing             Do not wear make-up, lotions, powders, perfumes/cologne, or deodorant              Men may shave face and neck.   Do not bring valuables to the hospital. [facility] IS NOT             RESPONSIBLE   FOR VALUABLES.   Contacts, glasses, dentures or bridgework may not be worn into surgery.    DO NOT BRING YOUR HOME MEDICATIONS TO THE HOSPITAL. PHARMACY WILL DISPENSE MEDICATIONS LISTED ON  YOUR MEDICATION LIST TO YOU DURING YOUR ADMISSION IN THE HOSPITAL!    Patients discharged on the day of surgery will not be allowed to drive home.  Someone NEEDS to stay with you for the first 24 hours after anesthesia.   Special Instructions: Bring a copy of your healthcare power of attorney and living will documents the day of surgery if you haven't scanned them before.              Please read over the following fact sheets you were given: IF YOU HAVE QUESTIONS ABOUT YOUR PRE-OP INSTRUCTIONS PLEASE CALL [phone] [place]   If you received a COVID test during your pre-op visit  it is requested that you wear a mask when out in public, stay away from anyone that may not be feeling well and notify your surgeon if you develop symptoms. If you test positive for Covid or have been in contact with anyone that has tested positive in the last 10 days please notify you surgeon.    [facility] - Preparing for Surgery Before surgery, you can play an important role.  Because skin is not sterile, your skin needs to be as free of germs as possible.  You can reduce the number of germs on your skin by washing with CHG (chlorahexidine gluconate) soap before surgery.  CHG is an antiseptic cleaner which kills germs and bonds with the skin to continue killing germs even after washing. Please DO NOT use if you have an allergy to CHG or antibacterial soaps.  If your skin becomes reddened/irritated stop using the CHG and inform your nurse when you arrive at Short Stay. Do not shave (including legs and underarms) for at least 48 hours prior to the first CHG shower.  You may shave your face/neck.  Please follow these instructions carefully:  1.  Shower with CHG Soap the night before surgery ONLY (DO NOT USE THE SOAP THE MORNING OF SURGERY).  2.  If you choose to wash your hair, wash your hair first as usual with your normal  shampoo.  3.  After you shampoo, rinse your hair and body thoroughly to remove the shampoo.                              4.  Use CHG as you would any other liquid soap.  You can apply chg directly to the skin and wash.  Gently with a scrungie or clean washcloth.  5.  Apply the CHG Soap to your body ONLY FROM THE NECK DOWN.   Do   not use on face/ open                           Wound or open sores. Avoid contact with eyes, ears mouth and   genitals (private parts).                       Wash face,  Genitals (private parts) with your normal soap.             6.  Wash thoroughly, paying special attention to the area where your    surgery  will be performed.  7.  Thoroughly rinse your body with warm water from the neck down.  8.  DO NOT shower/wash with your normal soap after using and rinsing off the CHG Soap.                9.  Pat yourself dry with a clean towel.            10.  Wear clean pajamas.            11.  Place clean sheets on your bed the night of your first shower and do not  sleep with pets. Day of Surgery : Do not apply any CHG, lotions/deodorants the morning of surgery.  Please wear clean clothes to the hospital/surgery center.  FAILURE TO FOLLOW THESE INSTRUCTIONS MAY RESULT IN THE CANCELLATION OF YOUR SURGERY  PATIENT SIGNATURE_________________________________  NURSE SIGNATURE__________________________________  ________________________________________________________________________

## 2024-06-23 ENCOUNTER — Ambulatory Visit (HOSPITAL_COMMUNITY)
Admission: RE | Admit: 2024-06-23 | Discharge: 2024-06-23 | Disposition: A | Discharge status: Home / Self Care | Source: Ambulatory Visit | Attending: Orthopaedic Surgery | Admitting: Orthopaedic Surgery

## 2024-06-23 ENCOUNTER — Encounter (HOSPITAL_COMMUNITY)
Admission: RE | Disposition: A | Discharge status: Home / Self Care | Payer: Self-pay | Source: Ambulatory Visit | Attending: Orthopaedic Surgery

## 2024-06-23 ENCOUNTER — Encounter (HOSPITAL_COMMUNITY): Payer: Self-pay | Admitting: Orthopaedic Surgery

## 2024-06-23 ENCOUNTER — Ambulatory Visit (HOSPITAL_COMMUNITY): Payer: Self-pay | Admitting: Anesthesiology

## 2024-06-23 ENCOUNTER — Other Ambulatory Visit: Payer: Self-pay

## 2024-06-23 ENCOUNTER — Ambulatory Visit (HOSPITAL_COMMUNITY): Payer: Self-pay | Admitting: Physician Assistant

## 2024-06-23 DIAGNOSIS — S92321A Displaced fracture of second metatarsal bone, right foot, initial encounter for closed fracture: Secondary | ICD-10-CM | POA: Insufficient documentation

## 2024-06-23 DIAGNOSIS — S93324A Dislocation of tarsometatarsal joint of right foot, initial encounter: Secondary | ICD-10-CM | POA: Diagnosis not present

## 2024-06-23 DIAGNOSIS — S93314A Dislocation of tarsal joint of right foot, initial encounter: Secondary | ICD-10-CM | POA: Diagnosis not present

## 2024-06-23 DIAGNOSIS — S92331A Displaced fracture of third metatarsal bone, right foot, initial encounter for closed fracture: Secondary | ICD-10-CM | POA: Insufficient documentation

## 2024-06-23 DIAGNOSIS — S92311A Displaced fracture of first metatarsal bone, right foot, initial encounter for closed fracture: Secondary | ICD-10-CM | POA: Diagnosis not present

## 2024-06-23 DIAGNOSIS — G8918 Other acute postprocedural pain: Secondary | ICD-10-CM | POA: Diagnosis not present

## 2024-06-23 DIAGNOSIS — S92811A Other fracture of right foot, initial encounter for closed fracture: Secondary | ICD-10-CM | POA: Insufficient documentation

## 2024-06-23 DIAGNOSIS — Z01818 Encounter for other preprocedural examination: Secondary | ICD-10-CM

## 2024-06-23 DIAGNOSIS — I10 Essential (primary) hypertension: Secondary | ICD-10-CM | POA: Insufficient documentation

## 2024-06-23 DIAGNOSIS — X58XXXA Exposure to other specified factors, initial encounter: Secondary | ICD-10-CM | POA: Insufficient documentation

## 2024-06-23 DIAGNOSIS — M1990 Unspecified osteoarthritis, unspecified site: Secondary | ICD-10-CM | POA: Insufficient documentation

## 2024-06-23 HISTORY — PX: ORIF TOE FRACTURE: SHX5032

## 2024-06-23 HISTORY — PX: CLOSED REDUCTION METATARSAL: SHX5774

## 2024-06-23 SURGERY — OPEN REDUCTION INTERNAL FIXATION (ORIF) METATARSAL (TOE) FRACTURE
Anesthesia: Regional | Site: Toe | Laterality: Right

## 2024-06-23 MED ORDER — FENTANYL CITRATE (PF) 100 MCG/2ML IJ SOLN
INTRAMUSCULAR | Status: AC
  Filled 2024-06-23: qty 2

## 2024-06-23 MED ORDER — ONDANSETRON HCL 4 MG/2ML IJ SOLN
INTRAMUSCULAR | Status: AC
  Filled 2024-06-23: qty 2

## 2024-06-23 MED ORDER — VANCOMYCIN HCL 1000 MG IV SOLR
INTRAVENOUS | Status: DC | PRN
  Administered 2024-06-23: 1000 mg via TOPICAL

## 2024-06-23 MED ORDER — CHLORHEXIDINE GLUCONATE 4 % EX SOLN: 60.0000 mL | Freq: Once | CUTANEOUS | Status: DC

## 2024-06-23 MED ORDER — ONDANSETRON HCL 4 MG/2ML IJ SOLN
INTRAMUSCULAR | Status: DC | PRN
  Administered 2024-06-23: 4 mg via INTRAVENOUS

## 2024-06-23 MED ORDER — CEFAZOLIN SODIUM-DEXTROSE 3-4 GM/150ML-% IV SOLN
3.0000 g | INTRAVENOUS | Status: AC
  Administered 2024-06-23: 3 g via INTRAVENOUS
  Filled 2024-06-23: qty 150

## 2024-06-23 MED ORDER — ROPIVACAINE HCL 5 MG/ML IJ SOLN
INTRAMUSCULAR | Status: DC | PRN
  Administered 2024-06-23: 40 mL via PERINEURAL

## 2024-06-23 MED ORDER — MIDAZOLAM HCL 2 MG/2ML IJ SOLN
INTRAMUSCULAR | Status: AC
  Filled 2024-06-23: qty 2

## 2024-06-23 MED ORDER — MIDAZOLAM HCL 5 MG/5ML IJ SOLN
INTRAMUSCULAR | Status: DC | PRN
  Administered 2024-06-23: 2 mg via INTRAVENOUS

## 2024-06-23 MED ORDER — PHENYLEPHRINE HCL (PRESSORS) 10 MG/ML IV SOLN
INTRAVENOUS | Status: AC
  Filled 2024-06-23: qty 1

## 2024-06-23 MED ORDER — LACTATED RINGERS IV SOLN: INTRAVENOUS | Status: DC

## 2024-06-23 MED ORDER — PHENYLEPHRINE HCL (PRESSORS) 10 MG/ML IV SOLN
INTRAVENOUS | Status: AC
Start: 2024-06-23 — End: 2024-06-23
  Filled 2024-06-23: qty 1

## 2024-06-23 MED ORDER — ASPIRIN 81 MG PO TBEC: 81.0000 mg | DELAYED_RELEASE_TABLET | Freq: Two times a day (BID) | ORAL | 0 refills | Status: AC

## 2024-06-23 MED ORDER — PROPOFOL 10 MG/ML IV BOLUS
INTRAVENOUS | Status: AC
  Filled 2024-06-23: qty 20

## 2024-06-23 MED ORDER — ORAL CARE MOUTH RINSE: 15.0000 mL | Freq: Once | OROMUCOSAL | Status: AC

## 2024-06-23 MED ORDER — 0.9 % SODIUM CHLORIDE (POUR BTL) OPTIME
TOPICAL | Status: DC | PRN
  Administered 2024-06-23: 1000 mL

## 2024-06-23 MED ORDER — ONDANSETRON 4 MG PO TBDP: 4.0000 mg | ORAL_TABLET | Freq: Three times a day (TID) | ORAL | 0 refills | Status: AC | PRN

## 2024-06-23 MED ORDER — FENTANYL CITRATE (PF) 100 MCG/2ML IJ SOLN
INTRAMUSCULAR | Status: DC | PRN
  Administered 2024-06-23: 50 ug via INTRAVENOUS
  Administered 2024-06-23: 100 ug via INTRAVENOUS

## 2024-06-23 MED ORDER — VANCOMYCIN HCL 1000 MG IV SOLR
INTRAVENOUS | Status: AC
Start: 2024-06-23 — End: 2024-06-23
  Filled 2024-06-23: qty 20

## 2024-06-23 MED ORDER — HYDROCODONE-ACETAMINOPHEN 7.5-325 MG PO TABS: 1.0000 | ORAL_TABLET | Freq: Once | ORAL | Status: DC | PRN

## 2024-06-23 MED ORDER — POVIDONE-IODINE 10 % EX SOLN
CUTANEOUS | Status: DC | PRN
  Administered 2024-06-23: 1 via TOPICAL

## 2024-06-23 MED ORDER — AMISULPRIDE (ANTIEMETIC) 5 MG/2ML IV SOLN: 10.0000 mg | Freq: Once | INTRAVENOUS | Status: DC | PRN

## 2024-06-23 MED ORDER — DEXAMETHASONE SOD PHOSPHATE PF 10 MG/ML IJ SOLN
INTRAMUSCULAR | Status: DC | PRN
  Administered 2024-06-23 (×2): 10 mg via PERINEURAL

## 2024-06-23 MED ORDER — ONDANSETRON HCL 4 MG/2ML IJ SOLN: 4.0000 mg | Freq: Once | INTRAMUSCULAR | Status: DC | PRN

## 2024-06-23 MED ORDER — LIDOCAINE 2% (20 MG/ML) 5 ML SYRINGE
INTRAMUSCULAR | Status: DC | PRN
  Administered 2024-06-23: 40 mg via INTRAVENOUS

## 2024-06-23 MED ORDER — ACETAMINOPHEN 500 MG PO TABS
1000.0000 mg | ORAL_TABLET | Freq: Once | ORAL | Status: DC
  Filled 2024-06-23: qty 2

## 2024-06-23 MED ORDER — KETOROLAC TROMETHAMINE 30 MG/ML IJ SOLN: 30.0000 mg | Freq: Once | INTRAMUSCULAR | Status: DC | PRN

## 2024-06-23 MED ORDER — HYDROMORPHONE HCL 1 MG/ML IJ SOLN: 0.2500 mg | INTRAMUSCULAR | Status: DC | PRN

## 2024-06-23 MED ORDER — DOCUSATE SODIUM 100 MG PO CAPS: 100.0000 mg | ORAL_CAPSULE | Freq: Two times a day (BID) | ORAL | 0 refills | Status: AC

## 2024-06-23 MED ORDER — LIDOCAINE HCL (PF) 2 % IJ SOLN
INTRAMUSCULAR | Status: AC
  Filled 2024-06-23: qty 5

## 2024-06-23 MED ORDER — ACETAMINOPHEN 500 MG PO TABS
500.0000 mg | ORAL_TABLET | Freq: Once | ORAL | Status: AC
  Administered 2024-06-23: 500 mg via ORAL

## 2024-06-23 MED ORDER — MEPERIDINE HCL 100 MG/ML IJ SOLN: 6.2500 mg | INTRAMUSCULAR | Status: DC | PRN

## 2024-06-23 MED ORDER — CHLORHEXIDINE GLUCONATE 0.12 % MT SOLN
15.0000 mL | Freq: Once | OROMUCOSAL | Status: AC
  Administered 2024-06-23: 15 mL via OROMUCOSAL

## 2024-06-23 MED ORDER — TRAMADOL HCL 50 MG PO TABS: 50.0000 mg | ORAL_TABLET | ORAL | 0 refills | Status: AC | PRN

## 2024-06-23 MED ORDER — PROPOFOL 10 MG/ML IV BOLUS
INTRAVENOUS | Status: DC | PRN
Start: 2024-06-23 — End: 2024-06-23
  Administered 2024-06-23: 30 mg via INTRAVENOUS
  Administered 2024-06-23: 200 mg via INTRAVENOUS

## 2024-06-23 SURGICAL SUPPLY — 75 items
BAG ZIPLOCK 12X15 (MISCELLANEOUS) IMPLANT
BIT DRILL 2.9 CANN QC NONSTRL (BIT) IMPLANT
BIT DRILL LONG 2.0 ZI (DRILL) IMPLANT
BLADE SURG 15 STRL LF DISP TIS (BLADE) ×12 IMPLANT
BNDG COHESIVE 4X5 TAN STRL LF (GAUZE/BANDAGES/DRESSINGS) ×3 IMPLANT
BNDG COMPR ESMARK 6X3 LF (GAUZE/BANDAGES/DRESSINGS) ×3 IMPLANT
BNDG ELASTIC 4INX 5YD STR LF (GAUZE/BANDAGES/DRESSINGS) ×6 IMPLANT
BNDG ELASTIC 6INX 5YD STR LF (GAUZE/BANDAGES/DRESSINGS) ×3 IMPLANT
BNDG GAUZE DERMACEA FLUFF 4 (GAUZE/BANDAGES/DRESSINGS) ×3 IMPLANT
BNDG STRETCH 4X75 STRL LF (GAUZE/BANDAGES/DRESSINGS) IMPLANT
BRUSH SCRUB EZ 4% CHG (MISCELLANEOUS) ×3 IMPLANT
CHLORAPREP W/TINT 26 (MISCELLANEOUS) ×6 IMPLANT
COVER BACK TABLE 60X90IN (DRAPES) ×3 IMPLANT
COVER FOOTSWITCH UNIV (MISCELLANEOUS) IMPLANT
COVER MAYO STAND STRL (DRAPES) IMPLANT
COVER SURGICAL LIGHT HANDLE (MISCELLANEOUS) IMPLANT
CUFF TRNQT CYL 34X4.125X (TOURNIQUET CUFF) ×3 IMPLANT
DRAPE EXTREMITY T 121X128X90 (DISPOSABLE) ×3 IMPLANT
DRAPE IMP U-DRAPE 54X76 (DRAPES) ×3 IMPLANT
DRAPE OEC MINIVIEW 54X84 (DRAPES) ×3 IMPLANT
DRAPE U-SHAPE 47X51 STRL (DRAPES) ×3 IMPLANT
DRIVER T15 RETENTION (MISCELLANEOUS) IMPLANT
DRSG MEPITEL 4X7.2 (GAUZE/BANDAGES/DRESSINGS) ×3 IMPLANT
DRSG MEPITEL 8X12 (GAUZE/BANDAGES/DRESSINGS) IMPLANT
ELECT REM PT RETURN 15FT ADLT (MISCELLANEOUS) ×3 IMPLANT
GAUZE PAD ABD 8X10 STRL (GAUZE/BANDAGES/DRESSINGS) ×15 IMPLANT
GAUZE SPONGE 4X4 12PLY STRL (GAUZE/BANDAGES/DRESSINGS) ×3 IMPLANT
GLOVE BIOGEL PI IND STRL 8 (GLOVE) ×3 IMPLANT
GLOVE SURG SS PI 7.5 STRL IVOR (GLOVE) ×6 IMPLANT
GOWN STRL REUS W/ TWL LRG LVL3 (GOWN DISPOSABLE) ×6 IMPLANT
KIT BASIN OR (CUSTOM PROCEDURE TRAY) ×3 IMPLANT
KIT TURNOVER KIT A (KITS) IMPLANT
KWIRE ALPS MXV 1.6X6 ZI (WIRE) IMPLANT
MANIFOLD NEPTUNE II (INSTRUMENTS) IMPLANT
MARKER SKIN DUAL TIP RULER LAB (MISCELLANEOUS) IMPLANT
NDL HYPO 22X1.5 SAFETY MO (MISCELLANEOUS) IMPLANT
NDL HYPO 25X1 1.5 SAFETY (NEEDLE) IMPLANT
NEEDLE HYPO 22X1.5 SAFETY MO (MISCELLANEOUS) ×2 IMPLANT
NEEDLE HYPO 25X1 1.5 SAFETY (NEEDLE) IMPLANT
NS IRRIG 1000ML POUR BTL (IV SOLUTION) ×3 IMPLANT
PACK ORTHO EXTREMITY (CUSTOM PROCEDURE TRAY) IMPLANT
PAD CAST 4YDX4 CTTN HI CHSV (CAST SUPPLIES) ×3 IMPLANT
PADDING CAST ABS COTTON 4X4 ST (CAST SUPPLIES) IMPLANT
PADDING CAST SYNTHETIC 4X4 STR (CAST SUPPLIES) ×6 IMPLANT
PADDING CAST SYNTHETIC 6X4 NS (CAST SUPPLIES) ×6 IMPLANT
PENCIL SMOKE EVACUATOR (MISCELLANEOUS) ×3 IMPLANT
PLATE 2.7 STR 6 H (Plate) IMPLANT
PROTECTOR NERVE ULNAR (MISCELLANEOUS) IMPLANT
SCREW CANC T15 FT 38X4XST TIP (Screw) IMPLANT
SCREW LOCK MDS 2.7X14 (Screw) IMPLANT
SCREW LOCKING 2.7X24 (Screw) IMPLANT
SCREW NLOCK 2.7X18 (Screw) IMPLANT
SCREW NLOCK ALPS MVX 2.7X17 (Screw) IMPLANT
SCREW NLOCK T15 FT 30X4XST TIP (Screw) IMPLANT
SHEET MEDIUM DRAPE 40X70 STRL (DRAPES) ×3 IMPLANT
SOL PREP POV-IOD 4OZ 10% (MISCELLANEOUS) IMPLANT
SPIKE FLUID TRANSFER (MISCELLANEOUS) IMPLANT
SPLINT PLASTER CAST XFAST 5X30 (CAST SUPPLIES) ×60 IMPLANT
SPONGE T-LAP 18X18 ~~LOC~~+RFID (SPONGE) ×3 IMPLANT
STOCKINETTE 6 STRL (DRAPES) ×3 IMPLANT
SUCTION TUBE FRAZIER 10FR DISP (SUCTIONS) IMPLANT
SUT ETHILON 2 0 FS 18 (SUTURE) ×6 IMPLANT
SUT ETHILON 2 0 PS N (SUTURE) IMPLANT
SUT MNCRL AB 3-0 PS2 18 (SUTURE) ×3 IMPLANT
SUT PDS II 3-0 CT2 27 ABS (SUTURE) IMPLANT
SUT VIC AB 0 SH 27 (SUTURE) IMPLANT
SUT VIC AB 2-0 SH 27XBRD (SUTURE) IMPLANT
SUT VIC AB 3-0 SH 27X BRD (SUTURE) IMPLANT
SYR 30ML LL (SYRINGE) IMPLANT
SYR BULB IRRIG 60ML STRL (SYRINGE) ×3 IMPLANT
SYR CONTROL 10ML LL (SYRINGE) IMPLANT
TOWEL GREEN STERILE FF (TOWEL DISPOSABLE) ×6 IMPLANT
TOWEL OR 17X26 10 PK STRL BLUE (TOWEL DISPOSABLE) IMPLANT
TUBING CONNECTING 10 (TUBING) ×3 IMPLANT
UNDERPAD 30X36 HEAVY ABSORB (UNDERPADS AND DIAPERS) ×3 IMPLANT

## 2024-06-23 NOTE — H&P (Signed)

## 2024-06-23 NOTE — Anesthesia Procedure Notes (Signed)
 Procedure Name: LMA Insertion Date/Time: 06/23/2024 7:59 AM  Performed by: Franchot Delon RAMAN, CRNAPre-anesthesia Checklist: Patient identified, Emergency Drugs available, Suction available and Patient being monitored Patient Re-evaluated:Patient Re-evaluated prior to induction Oxygen  Delivery Method: Circle System Utilized Preoxygenation: Pre-oxygenation with 100% oxygen  Induction Type: IV induction Ventilation: Mask ventilation without difficulty LMA: LMA with gastric port inserted LMA Size: 5.0 Number of attempts: 1 Placement Confirmation: positive ETCO2 Tube secured with: Tape Dental Injury: Teeth and Oropharynx as per pre-operative assessment

## 2024-06-23 NOTE — Anesthesia Procedure Notes (Signed)
 Anesthesia Regional Block: Popliteal block   Pre-Anesthetic Checklist: , timeout performed,  Correct Patient, Correct Site, Correct Laterality,  Correct Procedure, Correct Position, site marked,  Risks and benefits discussed,  Surgical consent,  Pre-op evaluation,  At surgeon's request and post-op pain management  Laterality: Right  Prep: Maximum Sterile Barrier Precautions used, chloraprep       Needles:  Injection technique: Single-shot  Needle Type: Echogenic Stimulator Needle     Needle Length: 9cm  Needle Gauge: 22     Additional Needles:   Procedures:,,,, ultrasound used (permanent image in chart),,    Narrative:  Start time: 06/23/2024 7:20 AM End time: 06/23/2024 7:25 AM Injection made incrementally with aspirations every 5 mL.  Performed by: Personally  Anesthesiologist: Merla Almarie HERO, DO  Additional Notes: Monitors applied. No increased pain on injection. No increased resistance to injection. Injection made in 5cc increments. Good needle visualization. Patient tolerated procedure well.

## 2024-06-23 NOTE — Transfer of Care (Signed)
 Immediate Anesthesia Transfer of Care Note  Patient: Todd [name]  Procedure(s) Performed: OPEN REDUCTION INTERNAL FIXATION (ORIF) 1ST METATARSAL AND MIDFOOT (Right: Toe) 2ND, 3RD, 4TH, 5TH METATARSAL CLOSED REDUCTION (Toe)  Patient Location: PACU  Anesthesia Type:General and Regional  Level of Consciousness: awake, alert , oriented, and drowsy  Airway & Oxygen  Therapy: Patient Spontanous Breathing and Patient connected to face mask  Post-op Assessment: Report given to RN and Post -op Vital signs reviewed and stable  Post vital signs: Reviewed and stable  Last Vitals:  Vitals Value Taken Time  BP 137/67 06/23/24 09:34  Temp 36.3 C 06/23/24 09:34  Pulse 46 06/23/24 09:37  Resp 17 06/23/24 09:37  SpO2 100 % 06/23/24 09:37  Vitals shown include unfiled device data.  Last Pain:  Vitals:   06/23/24 0934  TempSrc:   PainSc: 0-No pain      Patients Stated Pain Goal: 3 (06/23/24 0630)  Complications: No notable events documented.

## 2024-06-23 NOTE — Anesthesia Preprocedure Evaluation (Addendum)
 Anesthesia Evaluation  Patient identified by MRN, date of birth, ID band Patient awake    Reviewed: Allergy & Precautions, NPO status , Patient's Chart, lab work & pertinent test results  Airway Mallampati: I  TM Distance: >3 FB Neck ROM: Full    Dental  (+) Dental Advisory Given, Missing,    Pulmonary neg pulmonary ROS   Pulmonary exam normal breath sounds clear to auscultation       Cardiovascular hypertension (no home meds), Normal cardiovascular exam Rhythm:Regular Rate:Normal     Neuro/Psych negative neurological ROS  negative psych ROS   GI/Hepatic negative GI ROS, Neg liver ROS,,,  Endo/Other  BMI 38  Renal/GU negative Renal ROS  negative genitourinary   Musculoskeletal  (+) Arthritis , Osteoarthritis,    Abdominal  (+) + obese  Peds  Hematology negative hematology ROS (+)   Anesthesia Other Findings   Reproductive/Obstetrics negative OB ROS                              Anesthesia Physical Anesthesia Plan  ASA: 2  Anesthesia Plan: General and Regional   Post-op Pain Management: Tylenol  PO (pre-op)*   Induction: Intravenous  PONV Risk Score and Plan: 2 and Ondansetron , Dexamethasone , Midazolam and Treatment may vary due to age or medical condition  Airway Management Planned: LMA  Additional Equipment: None  Intra-op Plan:   Post-operative Plan: Extubation in OR  Informed Consent: I have reviewed the patients History and Physical, chart, labs and discussed the procedure including the risks, benefits and alternatives for the proposed anesthesia with the patient or authorized representative who has indicated his/her understanding and acceptance.     Dental advisory given  Plan Discussed with: CRNA  Anesthesia Plan Comments:          Anesthesia Quick Evaluation

## 2024-06-23 NOTE — Anesthesia Procedure Notes (Signed)
 Anesthesia Regional Block: Adductor canal block   Pre-Anesthetic Checklist: , timeout performed,  Correct Patient, Correct Site, Correct Laterality,  Correct Procedure, Correct Position, site marked,  Risks and benefits discussed,  Surgical consent,  Pre-op evaluation,  At surgeon's request and post-op pain management  Laterality: Right  Prep: Maximum Sterile Barrier Precautions used, chloraprep       Needles:  Injection technique: Single-shot  Needle Type: Echogenic Stimulator Needle     Needle Length: 9cm  Needle Gauge: 22     Additional Needles:   Procedures:,,,, ultrasound used (permanent image in chart),,    Narrative:  Start time: 06/23/2024 7:25 AM End time: 06/23/2024 7:30 AM Injection made incrementally with aspirations every 5 mL.  Performed by: Personally  Anesthesiologist: Merla Almarie HERO, DO  Additional Notes: Monitors applied. No increased pain on injection. No increased resistance to injection. Injection made in 5cc increments. Good needle visualization. Patient tolerated procedure well.

## 2024-06-23 NOTE — Progress Notes (Signed)
 Orthopedic Tech Progress Note Patient Details:  Todd [name] August 19, 1966 [id number]  Ortho Devices Type of Ortho Device: Darco shoe Ortho Device/Splint Location: RLE Ortho Device/Splint Interventions: Ordered   Dropped off Darco shoe to the OR.   Morna Pink 06/23/2024, 9:07 AM

## 2024-06-23 NOTE — Anesthesia Postprocedure Evaluation (Signed)
 Anesthesia Post Note  Patient: Todd [name]  Procedure(s) Performed: OPEN REDUCTION INTERNAL FIXATION (ORIF) 1ST METATARSAL AND MIDFOOT (Right: Toe) 2ND, 3RD, 4TH, 5TH METATARSAL CLOSED REDUCTION (Toe)     Patient location during evaluation: PACU Anesthesia Type: Regional and General Level of consciousness: awake and alert, oriented and patient cooperative Pain management: pain level controlled Vital Signs Assessment: post-procedure vital signs reviewed and stable Respiratory status: spontaneous breathing, nonlabored ventilation and respiratory function stable Cardiovascular status: blood pressure returned to baseline and stable Postop Assessment: no apparent nausea or vomiting Anesthetic complications: no   No notable events documented.  Last Vitals:  Vitals:   06/23/24 1000 06/23/24 1015  BP: 124/75 119/68  Pulse: (!) 45 (!) 47  Resp: 15 16  Temp: 36.4 C   SpO2: 98% 97%    Last Pain:  Vitals:   06/23/24 1015  TempSrc:   PainSc: 0-No pain                 [name]

## 2024-06-23 NOTE — Op Note (Signed)
 06/23/2024  1:00 PM   PATIENT: Todd [name]  57 y.o. male  MRN: [id number]   PRE-OPERATIVE DIAGNOSIS:   1] Closed displaced fracture of first, second and third metatarsal bones of right foot, initial encounter 2] Closed displaced Lisfranc fracture subluxation of right foot, initial encounter   POST-OPERATIVE DIAGNOSIS:   Same   PROCEDURE: 1] Right foot 1st metatarsal ORIF 2] Right foot Lisfranc joint complex ORIF 3] Right foot intercuneiform joint complex ORIF (medial-intercuneiform joint) 4] Right foot open reduction with manipulation 2nd metatarsal fracture 5] Right foot closed reduction with manipulation 3rd metatarsal fracture   SURGEON:  Lillia Mountain, MD   ASSISTANT: None   ANESTHESIA: General, regional   EBL: Minimal   TOURNIQUET:    Total Tourniquet Time Documented: Thigh (Right) - 53 minutes Total: Thigh (Right) - 53 minutes    COMPLICATIONS: None apparent   DISPOSITION: Extubated, awake and stable to recovery.   INDICATION FOR PROCEDURE: The patient presented with above diagnosis.  We discussed the diagnosis, alternative treatment options, risks and benefits of the above surgical intervention, as well as alternative non-operative treatments. All questions/concerns were addressed and the patient/family demonstrated appropriate understanding of the diagnosis, the procedure, the postoperative course, and overall prognosis. The patient wished to proceed with surgical intervention and signed an informed surgical consent as such, in each others presence prior to surgery.   PROCEDURE IN DETAIL: After preoperative consent was obtained and the correct operative site was identified, the patient was brought to the operating room supine on stretcher. General anesthesia was induced. Preoperative antibiotics were administered. Surgical timeout was taken. The patient was then positioned supine with an ipsilateral hip bump. The operative lower extremity was  prepped and draped in standard sterile fashion with a tourniquet around the thigh. The extremity was exsanguinated and the tourniquet was inflated to 275 mmHg.  A dorsal approach was made directly over the first metatarsal shaft. This was carried down to the level of the extensor hallucis longus tendon. The deep tissue was incised medial to the tendon, and the tendon was protected throughout the procedure.  Open reduction of the 1st metatarsal comminuted fracture was performed. A dorsal 2.7 MVX plate was used and centered over the metatarsal shaft. A combination of cortical and locking screws were implanted.  Open reduction of the 2nd metatarsal fracture was performed. Intraoperative fluoroscopy confirmed appropriate anatomic alignment of the fracture.  Abduction stress testing of the foot demonstrated instability at the Lisfranc complex. A dorsal incision was made to debride the area of the Lisfranc ligament and to place pointed reduction clamp. The midfoot was reduced and this was provisionally pinned with Kirschner wire in the orientation of the native Lisfranc ligament. The intercuneiform joint between the medial and intermedial cuneiforms was also pinned with Kirschner wire.    Medial incisions were made over these wires and dissection carried down to the level of bone. The wires were overdrilled sequentially with a cannulated drill and the wires were removed while maintaining midfoot reduction. Two solid 4.0 fully threaded headed screws were implanted and stability was noted to clinical and fluoroscopic testing.   Closed reduction of the 3rd metatarsal shaft fracture was performed using intraoperative fluoroscopy.  The surgical sites were thoroughly irrigated. The tourniquet was deflated and hemostasis achieved. Betadine and vancomycin powder were applied. The deep layers were closed using 2-0 vicryl. The skin was closed without tension.    The leg was cleaned with saline and sterile dressings  with gauze were applied. A  well padded bulky forefoot offloading shoe was applied. The patient was awakened from anesthesia and transported to the recovery room in stable condition.    FOLLOW UP PLAN: -transfer to PACU, then home -strict NWB operative extremity, maximum elevation. OK to heel WB only for pivot/transfer (after nerve block has completely worn off) -maintain dressings until follow up -DVT ppx: Aspirin  81 mg twice daily  -follow up as outpatient within 7-10 days for wound check -sutures out in 2-3 weeks in outpatient office   RADIOGRAPHS: AP, lateral, oblique and stress radiographs of the right foot were obtained intraoperatively. These showed interval reduction and fixation of the fractures. Manual stress radiographs were taken and the joints were noted to be stable following fixation. All hardware is appropriately positioned and of the appropriate lengths. No other acute injuries are noted.   Lillia Mountain Orthopaedic Surgery [facility]

## 2024-06-24 ENCOUNTER — Encounter (HOSPITAL_COMMUNITY): Payer: Self-pay | Admitting: Orthopaedic Surgery

## 2024-07-07 DIAGNOSIS — S72114A Nondisplaced fracture of greater trochanter of right femur, initial encounter for closed fracture: Secondary | ICD-10-CM | POA: Diagnosis not present

## 2024-07-21 DIAGNOSIS — S72114S Nondisplaced fracture of greater trochanter of right femur, sequela: Secondary | ICD-10-CM | POA: Diagnosis not present

## 2024-07-21 DIAGNOSIS — M25551 Pain in right hip: Secondary | ICD-10-CM | POA: Diagnosis not present

## 2024-07-30 DIAGNOSIS — M25551 Pain in right hip: Secondary | ICD-10-CM | POA: Diagnosis not present

## 2024-07-30 DIAGNOSIS — M79671 Pain in right foot: Secondary | ICD-10-CM | POA: Diagnosis not present

## 2024-08-05 DIAGNOSIS — S92241D Displaced fracture of medial cuneiform of right foot, subsequent encounter for fracture with routine healing: Secondary | ICD-10-CM | POA: Diagnosis not present

## 2024-08-05 DIAGNOSIS — S92331D Displaced fracture of third metatarsal bone, right foot, subsequent encounter for fracture with routine healing: Secondary | ICD-10-CM | POA: Diagnosis not present

## 2024-08-05 DIAGNOSIS — S92321D Displaced fracture of second metatarsal bone, right foot, subsequent encounter for fracture with routine healing: Secondary | ICD-10-CM | POA: Diagnosis not present

## 2024-08-05 DIAGNOSIS — S92311D Displaced fracture of first metatarsal bone, right foot, subsequent encounter for fracture with routine healing: Secondary | ICD-10-CM | POA: Diagnosis not present

## 2024-08-08 DIAGNOSIS — M79671 Pain in right foot: Secondary | ICD-10-CM | POA: Diagnosis not present

## 2024-08-08 DIAGNOSIS — M25551 Pain in right hip: Secondary | ICD-10-CM | POA: Diagnosis not present

## 2024-08-13 DIAGNOSIS — M25551 Pain in right hip: Secondary | ICD-10-CM | POA: Diagnosis not present

## 2024-08-13 DIAGNOSIS — M79671 Pain in right foot: Secondary | ICD-10-CM | POA: Diagnosis not present

## 2024-08-19 DIAGNOSIS — M25551 Pain in right hip: Secondary | ICD-10-CM | POA: Diagnosis not present

## 2024-08-19 DIAGNOSIS — M79671 Pain in right foot: Secondary | ICD-10-CM | POA: Diagnosis not present
